# Patient Record
Sex: Female | Born: 1975 | Hispanic: No | Marital: Married | Smoking: Never smoker
Health system: Southern US, Community
[De-identification: ages and names within clinical notes are randomized; demographics above are authoritative.]

## PROBLEM LIST (undated history)

## (undated) ENCOUNTER — Ambulatory Visit (HOSPITAL_COMMUNITY): Admission: EM | Disposition: A | Discharge status: Other Acute Inpatient Hospital | Payer: Self-pay

## (undated) DIAGNOSIS — J309 Allergic rhinitis, unspecified: Secondary | ICD-10-CM

## (undated) HISTORY — DX: Allergic rhinitis, unspecified: J30.9

---

## 2006-10-14 ENCOUNTER — Emergency Department (HOSPITAL_COMMUNITY): Admission: EM | Admit: 2006-10-14 | Discharge: 2006-10-14 | Payer: Self-pay | Admitting: Family Medicine

## 2006-10-15 ENCOUNTER — Emergency Department (HOSPITAL_COMMUNITY): Admission: EM | Admit: 2006-10-15 | Discharge: 2006-10-15 | Payer: Self-pay | Admitting: Family Medicine

## 2006-11-01 ENCOUNTER — Emergency Department (HOSPITAL_COMMUNITY): Admission: EM | Admit: 2006-11-01 | Discharge: 2006-11-01 | Payer: Self-pay | Admitting: Family Medicine

## 2007-02-27 ENCOUNTER — Inpatient Hospital Stay (HOSPITAL_COMMUNITY): Admission: AD | Admit: 2007-02-27 | Discharge: 2007-02-28 | Payer: Self-pay | Admitting: Obstetrics & Gynecology

## 2007-02-27 ENCOUNTER — Emergency Department (HOSPITAL_COMMUNITY): Admission: EM | Admit: 2007-02-27 | Discharge: 2007-02-27 | Payer: Self-pay | Admitting: Emergency Medicine

## 2007-07-02 ENCOUNTER — Ambulatory Visit (HOSPITAL_COMMUNITY): Admission: RE | Admit: 2007-07-02 | Discharge: 2007-07-02 | Payer: Self-pay | Admitting: Obstetrics & Gynecology

## 2007-07-07 ENCOUNTER — Emergency Department (HOSPITAL_COMMUNITY): Admission: EM | Admit: 2007-07-07 | Discharge: 2007-07-07 | Payer: Self-pay | Admitting: Emergency Medicine

## 2007-08-14 ENCOUNTER — Ambulatory Visit (HOSPITAL_COMMUNITY): Admission: RE | Admit: 2007-08-14 | Discharge: 2007-08-14 | Payer: Self-pay | Admitting: Family Medicine

## 2007-09-11 ENCOUNTER — Ambulatory Visit (HOSPITAL_COMMUNITY): Admission: RE | Admit: 2007-09-11 | Discharge: 2007-09-11 | Payer: Self-pay | Admitting: Family Medicine

## 2007-09-22 ENCOUNTER — Ambulatory Visit: Payer: Self-pay | Admitting: Family

## 2007-09-22 ENCOUNTER — Inpatient Hospital Stay (HOSPITAL_COMMUNITY): Admission: AD | Admit: 2007-09-22 | Discharge: 2007-09-24 | Payer: Self-pay | Admitting: Obstetrics & Gynecology

## 2011-03-07 LAB — I-STAT 8, (EC8 V) (CONVERTED LAB)
Acid-base deficit: 3 — ABNORMAL HIGH
Bicarbonate: 22.1
Glucose, Bld: 80
TCO2: 23
pCO2, Ven: 40.2 — ABNORMAL LOW
pH, Ven: 7.348 — ABNORMAL HIGH

## 2011-03-07 LAB — POCT URINALYSIS DIP (DEVICE)
Glucose, UA: NEGATIVE
Nitrite: NEGATIVE
Urobilinogen, UA: 1
pH: 5.5

## 2011-03-12 LAB — CBC
HCT: 36.4
Hemoglobin: 11.9 — ABNORMAL LOW
MCHC: 32.7
MCV: 79.6
RBC: 4.58
WBC: 10.2

## 2011-03-12 LAB — RPR: RPR Ser Ql: NONREACTIVE

## 2011-03-29 LAB — DIFFERENTIAL
Basophils Absolute: 0
Basophils Relative: 0
Eosinophils Absolute: 0.1
Eosinophils Relative: 2
Lymphocytes Relative: 23
Lymphs Abs: 1.1
Monocytes Absolute: 0.2
Monocytes Relative: 5
Neutro Abs: 3.3
Neutrophils Relative %: 70

## 2011-03-29 LAB — URINALYSIS, ROUTINE W REFLEX MICROSCOPIC
Protein, ur: NEGATIVE
Urobilinogen, UA: 0.2

## 2011-03-29 LAB — POCT PREGNANCY, URINE: Operator id: 247071

## 2011-03-29 LAB — CBC
HCT: 36.6
Hemoglobin: 12.1
MCHC: 33.1
MCV: 80.2
Platelets: 217
RBC: 4.56
RDW: 13.4
WBC: 4.6

## 2011-03-29 LAB — POCT URINALYSIS DIP (DEVICE)
Hgb urine dipstick: NEGATIVE
Ketones, ur: NEGATIVE
Specific Gravity, Urine: 1.02
pH: 7

## 2011-03-29 LAB — I-STAT 8, (EC8 V) (CONVERTED LAB)
BUN: 11
Chloride: 102
HCT: 40
Hemoglobin: 13.6
Operator id: 247071
Sodium: 136
pCO2, Ven: 40.8 — ABNORMAL LOW

## 2011-03-29 LAB — HCG, QUANTITATIVE, PREGNANCY

## 2011-03-29 LAB — GC/CHLAMYDIA PROBE AMP, GENITAL

## 2012-04-07 ENCOUNTER — Emergency Department (HOSPITAL_COMMUNITY)
Admission: EM | Admit: 2012-04-07 | Discharge: 2012-04-07 | Disposition: A | Discharge status: Home / Self Care | Payer: No Typology Code available for payment source | Attending: Emergency Medicine | Admitting: Emergency Medicine

## 2012-04-07 ENCOUNTER — Emergency Department (HOSPITAL_COMMUNITY): Payer: No Typology Code available for payment source

## 2012-04-07 ENCOUNTER — Encounter (HOSPITAL_COMMUNITY): Payer: Self-pay | Admitting: Emergency Medicine

## 2012-04-07 DIAGNOSIS — M5412 Radiculopathy, cervical region: Secondary | ICD-10-CM

## 2012-04-07 DIAGNOSIS — Y92410 Unspecified street and highway as the place of occurrence of the external cause: Secondary | ICD-10-CM | POA: Insufficient documentation

## 2012-04-07 DIAGNOSIS — S199XXA Unspecified injury of neck, initial encounter: Secondary | ICD-10-CM | POA: Insufficient documentation

## 2012-04-07 DIAGNOSIS — S0993XA Unspecified injury of face, initial encounter: Secondary | ICD-10-CM | POA: Insufficient documentation

## 2012-04-07 DIAGNOSIS — IMO0002 Reserved for concepts with insufficient information to code with codable children: Secondary | ICD-10-CM | POA: Insufficient documentation

## 2012-04-07 DIAGNOSIS — Y9389 Activity, other specified: Secondary | ICD-10-CM | POA: Insufficient documentation

## 2012-04-07 MED ORDER — PREDNISONE 20 MG PO TABS: ORAL_TABLET | ORAL | Status: DC

## 2012-04-07 MED ORDER — PREDNISONE 20 MG PO TABS
40.0000 mg | ORAL_TABLET | Freq: Once | ORAL | Status: AC
  Administered 2012-04-07: 40 mg via ORAL
  Filled 2012-04-07: qty 2

## 2012-04-07 MED ORDER — PREDNISONE 20 MG PO TABS: 40.0000 mg | ORAL_TABLET | Freq: Once | ORAL | Status: DC

## 2012-04-07 NOTE — ED Notes (Addendum)
Vietnamese interpretor use.
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
## 2012-04-07 NOTE — ED Notes (Signed)
Pt and family denies any questions upon discharge.

## 2012-04-09 NOTE — ED Provider Notes (Signed)
Medical screening examination/treatment/procedure(s) were performed by non-physician practitioner and as supervising physician I was immediately available for consultation/collaboration.   Gwyneth Sprout, MD 04/09/12 1328

## 2013-10-25 ENCOUNTER — Encounter (HOSPITAL_COMMUNITY): Payer: Self-pay | Admitting: Emergency Medicine

## 2013-10-25 ENCOUNTER — Emergency Department (INDEPENDENT_AMBULATORY_CARE_PROVIDER_SITE_OTHER)
Admission: EM | Admit: 2013-10-25 | Discharge: 2013-10-25 | Disposition: A | Discharge status: Home / Self Care | Payer: BC Managed Care – PPO | Source: Home / Self Care | Attending: Family Medicine | Admitting: Family Medicine

## 2013-10-25 DIAGNOSIS — J309 Allergic rhinitis, unspecified: Secondary | ICD-10-CM

## 2013-10-25 DIAGNOSIS — N390 Urinary tract infection, site not specified: Secondary | ICD-10-CM

## 2013-10-25 DIAGNOSIS — R300 Dysuria: Secondary | ICD-10-CM

## 2013-10-25 LAB — POCT URINALYSIS DIP (DEVICE)
BILIRUBIN URINE: NEGATIVE
GLUCOSE, UA: NEGATIVE mg/dL
KETONES UR: NEGATIVE mg/dL
NITRITE: POSITIVE — AB
Protein, ur: NEGATIVE mg/dL
Specific Gravity, Urine: 1.025 (ref 1.005–1.030)
Urobilinogen, UA: 0.2 mg/dL (ref 0.0–1.0)
pH: 6.5 (ref 5.0–8.0)

## 2013-10-25 LAB — GLUCOSE, CAPILLARY: GLUCOSE-CAPILLARY: 80 mg/dL (ref 70–99)

## 2013-10-25 LAB — POCT PREGNANCY, URINE: PREG TEST UR: NEGATIVE

## 2013-10-25 MED ORDER — NITROFURANTOIN MONOHYD MACRO 100 MG PO CAPS: 100.0000 mg | ORAL_CAPSULE | Freq: Two times a day (BID) | ORAL | Status: DC

## 2013-10-25 MED ORDER — LORATADINE 10 MG PO TABS: 10.0000 mg | ORAL_TABLET | Freq: Every day | ORAL | Status: DC

## 2013-10-25 MED ORDER — FLUTICASONE PROPIONATE 50 MCG/ACT NA SUSP: 2.0000 | Freq: Every day | NASAL | Status: DC

## 2013-10-25 NOTE — Discharge Instructions (Signed)
Allergic Rhinitis Allergic rhinitis is when the mucous membranes in the nose respond to allergens. Allergens are particles in the air that cause your body to have an allergic reaction. This causes you to release allergic antibodies. Through a chain of events, these eventually cause you to release histamine into the blood stream. Although meant to protect the body, it is this release of histamine that causes your discomfort, such as frequent sneezing, congestion, and an itchy, runny nose.  CAUSES  Seasonal allergic rhinitis (hay fever) is caused by pollen allergens that may come from grasses, trees, and weeds. Year-round allergic rhinitis (perennial allergic rhinitis) is caused by allergens such as house dust mites, pet dander, and mold spores.  SYMPTOMS   Nasal stuffiness (congestion).  Itchy, runny nose with sneezing and tearing of the eyes. DIAGNOSIS  Your health care provider can help you determine the allergen or allergens that trigger your symptoms. If you and your health care provider are unable to determine the allergen, skin or blood testing may be used. TREATMENT  Allergic Rhinitis does not have a cure, but it can be controlled by:  Medicines and allergy shots (immunotherapy).  Avoiding the allergen. Hay fever may often be treated with antihistamines in pill or nasal spray forms. Antihistamines block the effects of histamine. There are over-the-counter medicines that may help with nasal congestion and swelling around the eyes. Check with your health care provider before taking or giving this medicine.  If avoiding the allergen or the medicine prescribed do not work, there are many new medicines your health care provider can prescribe. Stronger medicine may be used if initial measures are ineffective. Desensitizing injections can be used if medicine and avoidance does not work. Desensitization is when a patient is given ongoing shots until the body becomes less sensitive to the allergen.  Make sure you follow up with your health care provider if problems continue. HOME CARE INSTRUCTIONS It is not possible to completely avoid allergens, but you can reduce your symptoms by taking steps to limit your exposure to them. It helps to know exactly what you are allergic to so that you can avoid your specific triggers. SEEK MEDICAL CARE IF:   You have a fever.  You develop a cough that does not stop easily (persistent).  You have shortness of breath.  You start wheezing.  Symptoms interfere with normal daily activities. Document Released: 02/26/2001 Document Revised: 03/24/2013 Document Reviewed: 02/08/2013 [facility] Patient Information 2014 [place], [place].  Dysuria Dysuria is the medical term for pain with urination. There are many causes for dysuria, but urinary tract infection is the most common. If a urinalysis was performed it can show that there is a urinary tract infection. A urine culture confirms that you or your child is sick. You will need to follow up with a healthcare provider because:  If a urine culture was done you will need to know the culture results and treatment recommendations.  If the urine culture was positive, you or your child will need to be put on antibiotics or know if the antibiotics prescribed are the right antibiotics for your urinary tract infection.  If the urine culture is negative (no urinary tract infection), then other causes may need to be explored or antibiotics need to be stopped. Today laboratory work may have been done and there does not seem to be an infection. If cultures were done they will take at least 24 to 48 hours to be completed. Today x-rays may have been taken and they  read as normal. No cause can be found for the problems. The x-rays may be re-read by a radiologist and you will be contacted if additional findings are made. You or your child may have been put on medications to help with this problem until you can see your primary  caregiver. If the problems get better, see your primary caregiver if the problems return. If you were given antibiotics (medications which kill germs), take all of the mediations as directed for the full course of treatment.  If laboratory work was done, you need to find the results. Leave a telephone number where you can be reached. If this is not possible, make sure you find out how you are to get test results. HOME CARE INSTRUCTIONS   Drink lots of fluids. For adults, drink eight, 8 ounce glasses of clear juice or water a day. For children, replace fluids as suggested by your caregiver.  Empty the bladder often. Avoid holding urine for long periods of time.  After a bowel movement, women should cleanse front to back, using each tissue only once.  Empty your bladder before and after sexual intercourse.  Take all the medicine given to you until it is gone. You may feel better in a few days, but TAKE ALL MEDICINE.  Avoid caffeine, tea, alcohol and carbonated beverages, because they tend to irritate the bladder.  In men, alcohol may irritate the prostate.  Only take over-the-counter or prescription medicines for pain, discomfort, or fever as directed by your caregiver.  If your caregiver has given you a follow-up appointment, it is very important to keep that appointment. Not keeping the appointment could result in a chronic or permanent injury, pain, and disability. If there is any problem keeping the appointment, you must call back to this facility for assistance. SEEK IMMEDIATE MEDICAL CARE IF:   Back pain develops.  A fever develops.  There is nausea (feeling sick to your stomach) or vomiting (throwing up).  Problems are no better with medications or are getting worse. MAKE SURE YOU:   Understand these instructions.  Will watch your condition.  Will get help right away if you are not doing well or get worse. Document Released: 03/01/2004 Document Revised: 08/26/2011 Document  Reviewed: 01/07/2008 [facility] IncExitCare Patient Information 2014 [place], [place].  Urinary Tract Infection Urinary tract infections (UTIs) can develop anywhere along your urinary tract. Your urinary tract is your body's drainage system for removing wastes and extra water. Your urinary tract includes two kidneys, two ureters, a bladder, and a urethra. Your kidneys are a pair of bean-shaped organs. Each kidney is about the size of your fist. They are located below your ribs, one on each side of your spine. CAUSES Infections are caused by microbes, which are microscopic organisms, including fungi, viruses, and bacteria. These organisms are so small that they can only be seen through a microscope. Bacteria are the microbes that most commonly cause UTIs. SYMPTOMS  Symptoms of UTIs may vary by age and gender of the patient and by the location of the infection. Symptoms in young women typically include a frequent and intense urge to urinate and a painful, burning feeling in the bladder or urethra during urination. Older women and men are more likely to be tired, shaky, and weak and have muscle aches and abdominal pain. A fever may mean the infection is in your kidneys. Other symptoms of a kidney infection include pain in your back or sides below the ribs, nausea, and vomiting. DIAGNOSIS To diagnose a  UTI, your caregiver will ask you about your symptoms. Your caregiver also will ask to provide a urine sample. The urine sample will be tested for bacteria and white blood cells. White blood cells are made by your body to help fight infection. TREATMENT  Typically, UTIs can be treated with medication. Because most UTIs are caused by a bacterial infection, they usually can be treated with the use of antibiotics. The choice of antibiotic and length of treatment depend on your symptoms and the type of bacteria causing your infection. HOME CARE INSTRUCTIONS  If you were prescribed antibiotics, take them exactly as your  caregiver instructs you. Finish the medication even if you feel better after you have only taken some of the medication.  Drink enough water and fluids to keep your urine clear or pale yellow.  Avoid caffeine, tea, and carbonated beverages. They tend to irritate your bladder.  Empty your bladder often. Avoid holding urine for long periods of time.  Empty your bladder before and after sexual intercourse.  After a bowel movement, women should cleanse from front to back. Use each tissue only once. SEEK MEDICAL CARE IF:   You have back pain.  You develop a fever.  Your symptoms do not begin to resolve within 3 days. SEEK IMMEDIATE MEDICAL CARE IF:   You have severe back pain or lower abdominal pain.  You develop chills.  You have nausea or vomiting.  You have continued burning or discomfort with urination. MAKE SURE YOU:   Understand these instructions.  Will watch your condition.  Will get help right away if you are not doing well or get worse. Document Released: 03/13/2005 Document Revised: 12/03/2011 Document Reviewed: 07/12/2011 [facility] Patient Information 2014 [place], [place].

## 2013-10-25 NOTE — ED Notes (Signed)
Via husband, [place] Pt c/o UTI sx onset 2 week Sx include: dysuria, urinary freq/urgency, abd pain Denies hematuria, back pain, f/n/d Also wants to talk to provider about allergies Alert w/no signs of acute distress.

## 2013-10-25 NOTE — ED Provider Notes (Signed)
Medical screening examination/treatment/procedure(s) were performed by resident physician or non-physician practitioner and as supervising physician I was immediately available for consultation/collaboration.   Amber Guthridge DOUGLAS MD.   Johnetta Sloniker D Merrilyn Legler, MD 10/25/13 2200 

## 2013-10-25 NOTE — ED Provider Notes (Signed)
CSN: [id number]     Arrival date & time 10/25/13  1838 History   First MD Initiated Contact with Patient 10/25/13 2006     Chief Complaint  Patient presents with  . Urinary Tract Infection  . Allergies   (Consider location/radiation/quality/duration/timing/severity/associated sxs/prior Treatment) HPI Comments: Patient reports 2 weeks of dysuria with frequent urination in small amounts. Some mild suprapubic pressure when emptying bladder.  No polydipsia No hematuria, flank pain or fever.  LNMP: receives DepoMedrol Injections No vaginal discharge or bleeding.  Also wishes to mention as an aside that she has some mild "allergy" symptoms.   The history is provided by the patient and a relative. The history is limited by a language barrier. A language interpreter was used.    History reviewed. No pertinent past medical history. History reviewed. No pertinent past surgical history. No family history on file. History  Substance Use Topics  . Smoking status: Never Smoker   . Smokeless tobacco: Not on file  . Alcohol Use: No   OB History   Grav Para Term Preterm Abortions TAB SAB Ect Mult Living                 Review of Systems  All other systems reviewed and are negative.   Allergies  Review of patient's allergies indicates no known allergies.  Home Medications   Prior to Admission medications   Medication Sig Start Date End Date Taking? Authorizing Provider  predniSONE (DELTASONE) 20 MG tablet 3 Tabs PO Days 1-3, then 2 tabs PO Days 4-6, then 1 tab PO Day 7-9, then Half Tab PO Day 10-12 04/07/12   Arman FilterGail K Schulz, NP   BP 145/98  Pulse 99  Temp(Src) 98.5 F (36.9 C) (Oral)  Resp 16  SpO2 100% Physical Exam  Nursing note and vitals reviewed. Constitutional: She is oriented to person, place, and time. She appears well-developed and well-nourished. No distress.  HENT:  Head: Normocephalic and atraumatic.  Right Ear: External ear normal.  Left Ear: External ear normal.   Nose: Nose normal.  Mouth/Throat: Oropharynx is clear and moist.  Eyes: Conjunctivae are normal. Right eye exhibits no discharge. Left eye exhibits no discharge. No scleral icterus.  Cardiovascular: Normal rate, regular rhythm and normal heart sounds.   Pulmonary/Chest: Effort normal and breath sounds normal.  Abdominal: Soft. Normal appearance and bowel sounds are normal. She exhibits no distension. There is no tenderness. There is no rigidity, no rebound, no guarding and no CVA tenderness.  Musculoskeletal: Normal range of motion.  Neurological: She is alert and oriented to person, place, and time.  Skin: Skin is warm and dry. No rash noted. No erythema.  Psychiatric: She has a normal mood and affect. Her behavior is normal.    ED Course  Procedures (including critical care time) Labs Review Labs Reviewed  POCT URINALYSIS DIP (DEVICE) - Abnormal; Notable for the following:    Hgb urine dipstick TRACE (*)    Nitrite POSITIVE (*)    Leukocytes, UA SMALL (*)    All other components within normal limits  URINE CULTURE  GLUCOSE, CAPILLARY  POCT PREGNANCY, URINE    Imaging Review No results found.   MDM   1. Allergic rhinitis   2. Dysuria   3. UTI (lower urinary tract infection)   Claritin and flonase for hayfever. Macrobid as prescribed for infection. Urine sent for C&S. Follow up if no improvement   Jess BartersJennifer Lee WestervillePresson, GeorgiaPA 10/25/13 2103

## 2013-10-26 LAB — POCT URINALYSIS DIP (DEVICE)
BILIRUBIN URINE: NEGATIVE
Glucose, UA: NEGATIVE mg/dL
KETONES UR: NEGATIVE mg/dL
Nitrite: POSITIVE — AB
PH: 6.5 (ref 5.0–8.0)
PROTEIN: NEGATIVE mg/dL
SPECIFIC GRAVITY, URINE: 1.02 (ref 1.005–1.030)
Urobilinogen, UA: 0.2 mg/dL (ref 0.0–1.0)

## 2013-10-28 LAB — URINE CULTURE

## 2013-10-28 NOTE — ED Notes (Signed)
Urine culture: 10,000 colonies E. Coli.  Pt. adequately treated with Macrobid. Desiree LucySuzanne M Whitehall Surgery CenterYork 10/28/2013

## 2014-08-05 ENCOUNTER — Emergency Department (HOSPITAL_COMMUNITY)
Admission: EM | Admit: 2014-08-05 | Discharge: 2014-08-05 | Disposition: A | Discharge status: Home / Self Care | Payer: No Typology Code available for payment source | Attending: Emergency Medicine | Admitting: Emergency Medicine

## 2014-08-05 ENCOUNTER — Encounter (HOSPITAL_COMMUNITY): Payer: Self-pay | Admitting: *Deleted

## 2014-08-05 ENCOUNTER — Emergency Department (HOSPITAL_COMMUNITY): Payer: No Typology Code available for payment source

## 2014-08-05 DIAGNOSIS — Z7952 Long term (current) use of systemic steroids: Secondary | ICD-10-CM | POA: Insufficient documentation

## 2014-08-05 DIAGNOSIS — Z79899 Other long term (current) drug therapy: Secondary | ICD-10-CM | POA: Diagnosis not present

## 2014-08-05 DIAGNOSIS — M542 Cervicalgia: Secondary | ICD-10-CM

## 2014-08-05 DIAGNOSIS — Y998 Other external cause status: Secondary | ICD-10-CM | POA: Insufficient documentation

## 2014-08-05 DIAGNOSIS — S199XXA Unspecified injury of neck, initial encounter: Secondary | ICD-10-CM | POA: Insufficient documentation

## 2014-08-05 DIAGNOSIS — Y9389 Activity, other specified: Secondary | ICD-10-CM | POA: Insufficient documentation

## 2014-08-05 DIAGNOSIS — S3992XA Unspecified injury of lower back, initial encounter: Secondary | ICD-10-CM | POA: Diagnosis not present

## 2014-08-05 DIAGNOSIS — S299XXA Unspecified injury of thorax, initial encounter: Secondary | ICD-10-CM | POA: Diagnosis not present

## 2014-08-05 DIAGNOSIS — Z7951 Long term (current) use of inhaled steroids: Secondary | ICD-10-CM | POA: Insufficient documentation

## 2014-08-05 DIAGNOSIS — Y92410 Unspecified street and highway as the place of occurrence of the external cause: Secondary | ICD-10-CM | POA: Insufficient documentation

## 2014-08-05 DIAGNOSIS — M549 Dorsalgia, unspecified: Secondary | ICD-10-CM

## 2014-08-05 DIAGNOSIS — R0789 Other chest pain: Secondary | ICD-10-CM

## 2014-08-05 MED ORDER — HYDROCODONE-ACETAMINOPHEN 5-325 MG PO TABS: 1.0000 | ORAL_TABLET | ORAL | Status: DC | PRN

## 2014-08-05 MED ORDER — OXYCODONE-ACETAMINOPHEN 5-325 MG PO TABS
1.0000 | ORAL_TABLET | Freq: Once | ORAL | Status: AC
  Administered 2014-08-05: 1 via ORAL
  Filled 2014-08-05: qty 1

## 2014-08-05 MED ORDER — METHOCARBAMOL 500 MG PO TABS: 500.0000 mg | ORAL_TABLET | Freq: Two times a day (BID) | ORAL | Status: DC
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
## 2014-08-05 NOTE — Discharge Instructions (Signed)
Take the prescribed medication as directed to help with pain/soreness. Return to the ED for new or worsening symptoms.

## 2014-08-05 NOTE — ED Notes (Signed)
Patient returned form Xray.  
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
## 2014-12-07 ENCOUNTER — Encounter (HOSPITAL_COMMUNITY): Payer: Self-pay | Admitting: *Deleted

## 2014-12-07 ENCOUNTER — Emergency Department (HOSPITAL_COMMUNITY)
Admission: EM | Admit: 2014-12-07 | Discharge: 2014-12-07 | Disposition: A | Discharge status: Home / Self Care | Payer: BLUE CROSS/BLUE SHIELD | Attending: Emergency Medicine | Admitting: Emergency Medicine

## 2014-12-07 DIAGNOSIS — K029 Dental caries, unspecified: Secondary | ICD-10-CM | POA: Diagnosis not present

## 2014-12-07 DIAGNOSIS — M546 Pain in thoracic spine: Secondary | ICD-10-CM

## 2014-12-07 DIAGNOSIS — S299XXA Unspecified injury of thorax, initial encounter: Secondary | ICD-10-CM | POA: Insufficient documentation

## 2014-12-07 DIAGNOSIS — Z041 Encounter for examination and observation following transport accident: Secondary | ICD-10-CM

## 2014-12-07 DIAGNOSIS — R6884 Jaw pain: Secondary | ICD-10-CM

## 2014-12-07 DIAGNOSIS — S199XXA Unspecified injury of neck, initial encounter: Secondary | ICD-10-CM | POA: Diagnosis not present

## 2014-12-07 DIAGNOSIS — S0993XA Unspecified injury of face, initial encounter: Secondary | ICD-10-CM | POA: Diagnosis present

## 2014-12-07 DIAGNOSIS — S3991XA Unspecified injury of abdomen, initial encounter: Secondary | ICD-10-CM | POA: Insufficient documentation

## 2014-12-07 DIAGNOSIS — Y998 Other external cause status: Secondary | ICD-10-CM | POA: Insufficient documentation

## 2014-12-07 DIAGNOSIS — S0083XA Contusion of other part of head, initial encounter: Secondary | ICD-10-CM | POA: Insufficient documentation

## 2014-12-07 DIAGNOSIS — Z792 Long term (current) use of antibiotics: Secondary | ICD-10-CM | POA: Diagnosis not present

## 2014-12-07 DIAGNOSIS — Z7951 Long term (current) use of inhaled steroids: Secondary | ICD-10-CM | POA: Diagnosis not present

## 2014-12-07 DIAGNOSIS — Y92410 Unspecified street and highway as the place of occurrence of the external cause: Secondary | ICD-10-CM | POA: Insufficient documentation

## 2014-12-07 DIAGNOSIS — Y9389 Activity, other specified: Secondary | ICD-10-CM | POA: Diagnosis not present

## 2014-12-07 DIAGNOSIS — Z79899 Other long term (current) drug therapy: Secondary | ICD-10-CM | POA: Insufficient documentation

## 2014-12-07 DIAGNOSIS — Z043 Encounter for examination and observation following other accident: Secondary | ICD-10-CM

## 2014-12-07 MED ORDER — METHOCARBAMOL 500 MG PO TABS: 500.0000 mg | ORAL_TABLET | Freq: Two times a day (BID) | ORAL | Status: DC

## 2014-12-07 MED ORDER — IBUPROFEN 800 MG PO TABS: 800.0000 mg | ORAL_TABLET | Freq: Three times a day (TID) | ORAL | Status: DC
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
## 2014-12-07 NOTE — ED Notes (Signed)
Declined W/C at D/C and was escorted to lobby by RN. 
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
## 2014-12-07 NOTE — Discharge Instructions (Signed)
Back Pain, Adult Low back pain is very common. About 1 in 5 people have back pain.The cause of low back pain is rarely dangerous. The pain often gets better over time.About half of people with a sudden onset of back pain feel better in just 2 weeks. About 8 in 10 people feel better by 6 weeks.  CAUSES Some common causes of back pain include:  Strain of the muscles or ligaments supporting the spine.  Wear and tear (degeneration) of the spinal discs.  Arthritis.  Direct injury to the back. DIAGNOSIS Most of the time, the direct cause of low back pain is not known.However, back pain can be treated effectively even when the exact cause of the pain is unknown.Answering your caregiver's questions about your overall health and symptoms is one of the most accurate ways to make sure the cause of your pain is not dangerous. If your caregiver needs more information, he or she may order lab work or imaging tests (X-rays or MRIs).However, even if imaging tests show changes in your back, this usually does not require surgery. HOME CARE INSTRUCTIONS For many people, back pain returns.Since low back pain is rarely dangerous, it is often a condition that people can learn to manageon their own.   Remain active. It is stressful on the back to sit or stand in one place. Do not sit, drive, or stand in one place for more than 30 minutes at a time. Take short walks on level surfaces as soon as pain allows.Try to increase the length of time you walk each day.  Do not stay in bed.Resting more than 1 or 2 days can delay your recovery.  Do not avoid exercise or work.Your body is made to move.It is not dangerous to be active, even though your back may hurt.Your back will likely heal faster if you return to being active before your pain is gone.  Pay attention to your body when you bend and lift. Many people have less discomfortwhen lifting if they bend their knees, keep the load close to their bodies,and  avoid twisting. Often, the most comfortable positions are those that put less stress on your recovering back.  Find a comfortable position to sleep. Use a firm mattress and lie on your side with your knees slightly bent. If you lie on your back, put a pillow under your knees.  Only take over-the-counter or prescription medicines as directed by your caregiver. Over-the-counter medicines to reduce pain and inflammation are often the most helpful.Your caregiver may prescribe muscle relaxant drugs.These medicines help dull your pain so you can more quickly return to your normal activities and healthy exercise.  Put ice on the injured area.  Put ice in a plastic bag.  Place a towel between your skin and the bag.  Leave the ice on for 15-20 minutes, 03-04 times a day for the first 2 to 3 days. After that, ice and heat may be alternated to reduce pain and spasms.  Ask your caregiver about trying back exercises and gentle massage. This may be of some benefit.  Avoid feeling anxious or stressed.Stress increases muscle tension and can worsen back pain.It is important to recognize when you are anxious or stressed and learn ways to manage it.Exercise is a great option. SEEK MEDICAL CARE IF:  You have pain that is not relieved with rest or medicine.  You have pain that does not improve in 1 week.  You have new symptoms.  You are generally not feeling well. SEEK   IMMEDIATE MEDICAL CARE IF:   You have pain that radiates from your back into your legs.  You develop new bowel or bladder control problems.  You have unusual weakness or numbness in your arms or legs.  You develop nausea or vomiting.  You develop abdominal pain.  You feel faint. Document Released: 06/03/2005 Document Revised: 12/03/2011 Document Reviewed: 10/05/2013 [employer] Patient Information 2015 [employer]. This information is not intended to replace advice given to you by your health care provider. Make sure you  discuss any questions you have with your health care provider.  
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
## 2016-09-26 ENCOUNTER — Ambulatory Visit (HOSPITAL_COMMUNITY)
Admission: EM | Admit: 2016-09-26 | Discharge: 2016-09-26 | Disposition: A | Discharge status: Home / Self Care | Payer: Self-pay | Attending: Family Medicine | Admitting: Family Medicine

## 2016-09-26 ENCOUNTER — Encounter (HOSPITAL_COMMUNITY): Payer: Self-pay | Admitting: Emergency Medicine

## 2016-09-26 DIAGNOSIS — Z3202 Encounter for pregnancy test, result negative: Secondary | ICD-10-CM

## 2016-09-26 DIAGNOSIS — N72 Inflammatory disease of cervix uteri: Secondary | ICD-10-CM | POA: Insufficient documentation

## 2016-09-26 DIAGNOSIS — R300 Dysuria: Secondary | ICD-10-CM | POA: Insufficient documentation

## 2016-09-26 DIAGNOSIS — R102 Pelvic and perineal pain: Secondary | ICD-10-CM | POA: Insufficient documentation

## 2016-09-26 LAB — POCT URINALYSIS DIP (DEVICE)
Bilirubin Urine: NEGATIVE
Glucose, UA: NEGATIVE mg/dL
Ketones, ur: NEGATIVE mg/dL
Leukocytes, UA: NEGATIVE
Nitrite: NEGATIVE
Protein, ur: 100 mg/dL — AB
Specific Gravity, Urine: >=1.03 (ref 1.005–1.030)
Urobilinogen, UA: 0.2 mg/dL (ref 0.0–1.0)
pH: 6 (ref 5.0–8.0)

## 2016-09-26 LAB — POCT PREGNANCY, URINE: Preg Test, Ur: NEGATIVE

## 2016-09-26 MED ORDER — METRONIDAZOLE 500 MG PO TABS: 500.0000 mg | ORAL_TABLET | Freq: Two times a day (BID) | ORAL | 0 refills | Status: DC

## 2016-09-26 MED ORDER — DOXYCYCLINE HYCLATE 100 MG PO CAPS: 100.0000 mg | ORAL_CAPSULE | Freq: Two times a day (BID) | ORAL | 0 refills | Status: DC

## 2016-09-26 NOTE — Discharge Instructions (Signed)
You have tenderness in the cervix as well as over the female organs of th. This suggests some sort of infection. You will be treated with 2 antibiotics. If you are not getting better he should follow up with your primary care doctor or if necessary return. A culture of your urine will be performed. Be sure to drink plenty of fluids and stay well-hydrated.

## 2016-09-26 NOTE — ED Provider Notes (Signed)
CSN: [id number]     Arrival date & time 09/26/16  1001 History   First MD Initiated Contact with Patient 09/26/16 1106     Chief Complaint  Patient presents with  . Urinary Tract Infection   (Consider location/radiation/quality/duration/timing/severity/associated sxs/prior Treatment) 41 year old [place] female accompanied by significant other female who helps translate. She complains of dysuria with small void volumes of urine this started yesterday. Denies frequency. She is complaining of discomfort over the lower pelvis/suprapubic. Otherwise her only complaint is feeling cold. No nausea or vomiting, diarrhea.      History reviewed. No pertinent past medical history. History reviewed. No pertinent surgical history. No family history on file. Social History  Substance Use Topics  . Smoking status: Never Smoker  . Smokeless tobacco: Not on file  . Alcohol use No   OB History    No data available     Review of Systems  Constitutional: Negative.   HENT: Negative.   Respiratory: Negative.   Gastrointestinal: Negative.   Genitourinary: Positive for dysuria. Negative for frequency, hematuria, menstrual problem and vaginal discharge.  Neurological: Negative.   All other systems reviewed and are negative.   Allergies  Patient has no known allergies.  Home Medications   Prior to Admission medications   Medication Sig Start Date End Date Taking? Authorizing Provider  Calcium-Magnesium-Vitamin D (CALCIUM 500 PO) Take by mouth.   Yes Historical Provider, MD  MedroxyPROGESTERone Acetate (DEPO-PROVERA IM) Inject into the muscle.   Yes Historical Provider, MD  doxycycline (VIBRAMYCIN) 100 MG capsule Take 1 capsule (100 mg total) by mouth 2 (two) times daily. 09/26/16   Hayden Rasmussen, NP  fluticasone (FLONASE) 50 MCG/ACT nasal spray Place 2 sprays into both nostrils daily. 10/25/13   Ria Clock, PA  HYDROcodone-acetaminophen (NORCO/VICODIN) 5-325 MG per tablet Take 1 tablet by  mouth every 4 (four) hours as needed. 08/05/14   Garlon Hatchet, PA-C  ibuprofen (ADVIL,MOTRIN) 800 MG tablet Take 1 tablet (800 mg total) by mouth 3 (three) times daily. 12/07/14   Danelle Berry, PA-C  loratadine (CLARITIN) 10 MG tablet Take 1 tablet (10 mg total) by mouth daily. 10/25/13   Mathis Fare Presson, PA  methocarbamol (ROBAXIN) 500 MG tablet Take 1 tablet (500 mg total) by mouth 2 (two) times daily. 12/07/14   Danelle Berry, PA-C  metroNIDAZOLE (FLAGYL) 500 MG tablet Take 1 tablet (500 mg total) by mouth 2 (two) times daily. X 7 days 09/26/16   Hayden Rasmussen, NP   Meds Ordered and Administered this Visit  Medications - No data to display  BP 124/78 (BP Location: Left Arm)   Pulse 72   Temp 98.6 F (37 C) (Oral)   Resp 16   SpO2 99%  No data found.   Physical Exam  Constitutional: She is oriented to person, place, and time. She appears well-developed and well-nourished. No distress.  Eyes: EOM are normal.  Neck: Neck supple.  Cardiovascular: Normal rate, regular rhythm and normal heart sounds.   Pulmonary/Chest: Effort normal and breath sounds normal. No respiratory distress.  Abdominal: Soft. Bowel sounds are normal. She exhibits no distension.  Tenderness over the lower most mid abdomen and suprapubic area. Tenderness bilaterally over the anterior pelvis. Palpable masses likely stool along the transverse colon. Mildly tender.  Genitourinary:  Genitourinary Comments: Normal external female genitalia. No evidence of Bartholin gland enlargement or infection, meatus is pink no surrounding erythema or swelling. No vaginal discharge is appreciated. No evidence of vaginal atrophy. Cervix isolated.  The cervix is midline. There is a patch of lumpy erythemal at the 6:00 position. This area is friable and swabbing around the os produces a blood flow. No discharge is seen. Repeated exam with CM produces tenderness. There is also tenderness to the bilateral adnexa.  Musculoskeletal: She exhibits  no edema.  Neurological: She is alert and oriented to person, place, and time. She exhibits normal muscle tone.  Skin: Skin is warm and dry.  Psychiatric: She has a normal mood and affect.  Nursing note and vitals reviewed.   Urgent Care Course     Procedures (including critical care time)  Labs Review Labs Reviewed  POCT URINALYSIS DIP (DEVICE) - Abnormal; Notable for the following:       Result Value   Hgb urine dipstick TRACE (*)    Protein, ur 100 (*)    All other components within normal limits  URINE CULTURE  POCT PREGNANCY, URINE  CERVICOVAGINAL ANCILLARY ONLY    Imaging Review No results found.   Visual Acuity Review  Right Eye Distance:   Left Eye Distance:   Bilateral Distance:    Right Eye Near:   Left Eye Near:    Bilateral Near:         MDM   1. Pelvic pain   2. Cervicitis   3. Dysuria    Patrecia Pace RN present You have tenderness in the cervix as well as over the female organs of th. This suggests some sort of infection. You will be treated with 2 antibiotics. If you are not getting better he should follow up with your primary care doctor or if necessary return. A culture of your urine will be performed. Be sure to drink plenty of fluids and stay well-hydrated. Meds ordered this encounter  Medications  . Calcium-Magnesium-Vitamin D (CALCIUM 500 PO)    Sig: Take by mouth.  . MedroxyPROGESTERone Acetate (DEPO-PROVERA IM)    Sig: Inject into the muscle.  Marland Kitchen doxycycline (VIBRAMYCIN) 100 MG capsule    Sig: Take 1 capsule (100 mg total) by mouth 2 (two) times daily.    Dispense:  20 capsule    Refill:  0    Order Specific Question:   Supervising Provider    Answer:   Elvina Sidle [[id number]]  . metroNIDAZOLE (FLAGYL) 500 MG tablet    Sig: Take 1 tablet (500 mg total) by mouth 2 (two) times daily. X 7 days    Dispense:  14 tablet    Refill:  0    Order Specific Question:   Supervising Provider    Answer:   Elvina Sidle [[id number]]        Hayden Rasmussen, NP 09/26/16 1145

## 2016-09-26 NOTE — ED Triage Notes (Signed)
Pain with urination and low abdominal pain, onset yesterday

## 2016-09-27 LAB — CERVICOVAGINAL ANCILLARY ONLY
Bacterial vaginitis: NEGATIVE
Chlamydia: NEGATIVE
Neisseria Gonorrhea: NEGATIVE
TRICH (WINDOWPATH): NEGATIVE

## 2016-09-27 LAB — URINE CULTURE: Culture: NO GROWTH

## 2017-03-13 ENCOUNTER — Other Ambulatory Visit: Payer: Self-pay | Admitting: Obstetrics & Gynecology

## 2017-03-13 DIAGNOSIS — Z1231 Encounter for screening mammogram for malignant neoplasm of breast: Secondary | ICD-10-CM

## 2017-06-17 ENCOUNTER — Telehealth (HOSPITAL_COMMUNITY): Payer: Self-pay | Admitting: Emergency Medicine

## 2017-06-17 ENCOUNTER — Ambulatory Visit (HOSPITAL_COMMUNITY)
Admission: EM | Admit: 2017-06-17 | Discharge: 2017-06-17 | Disposition: A | Discharge status: Home / Self Care | Payer: Self-pay | Attending: Family Medicine | Admitting: Family Medicine

## 2017-06-17 ENCOUNTER — Encounter (HOSPITAL_COMMUNITY): Payer: Self-pay | Admitting: Emergency Medicine

## 2017-06-17 ENCOUNTER — Other Ambulatory Visit: Payer: Self-pay

## 2017-06-17 DIAGNOSIS — R197 Diarrhea, unspecified: Secondary | ICD-10-CM | POA: Insufficient documentation

## 2017-06-17 DIAGNOSIS — R109 Unspecified abdominal pain: Secondary | ICD-10-CM | POA: Insufficient documentation

## 2017-06-17 LAB — POCT URINALYSIS DIP (DEVICE)
Bilirubin Urine: NEGATIVE
Glucose, UA: NEGATIVE mg/dL
Hgb urine dipstick: NEGATIVE
Ketones, ur: NEGATIVE mg/dL
Leukocytes, UA: NEGATIVE
Nitrite: NEGATIVE
Protein, ur: NEGATIVE mg/dL
Specific Gravity, Urine: 1.025 (ref 1.005–1.030)
Urobilinogen, UA: 0.2 mg/dL (ref 0.0–1.0)
pH: 6 (ref 5.0–8.0)

## 2017-06-17 MED ORDER — LOPERAMIDE HCL 2 MG PO CAPS: 2.0000 mg | ORAL_CAPSULE | Freq: Four times a day (QID) | ORAL | 0 refills | Status: DC | PRN

## 2017-06-17 NOTE — ED Triage Notes (Signed)
Pt c/o generalized abdominal pain and diarrhea for a week.

## 2017-06-17 NOTE — Discharge Instructions (Signed)
Bring the stool sample back for analysis.

## 2017-06-17 NOTE — ED Provider Notes (Signed)
06/17/2017 1:30 PM   DOB: 07/21/1975 / MRN: [id number]  SUBJECTIVE:  Tracy Campos is a 42 y.o. female presenting for diarrhea that started 6 days ago and is not getting better.  Having 6-7 loose non bloody stools daily. She can tolerate PO liquids. Has not tried meds yet.   She has No Known Allergies.   She  has no past medical history on file.    She  reports that  has never smoked. She does not have any smokeless tobacco history on file. She reports that she does not drink alcohol or use drugs. She  has no sexual activity history on file. The patient  has no past surgical history on file.  Her family history is not on file.  Review of Systems  Constitutional: Negative for fever.  Gastrointestinal: Positive for abdominal pain and diarrhea. Negative for blood in stool, heartburn, melena, nausea and vomiting.  Skin: Negative for itching and rash.  Neurological: Negative for weakness.    OBJECTIVE:  BP 124/82   Pulse 86   Temp 98.4 F (36.9 C)   Resp 16   SpO2 100%   Physical Exam  Cardiovascular: Normal rate.  Pulmonary/Chest: Effort normal.  Abdominal: Soft. She exhibits no distension and no mass. Bowel sounds are increased. There is no tenderness. There is no rebound, no guarding and no CVA tenderness.    Results for orders placed or performed during the hospital encounter of 06/17/17 (from the past 72 hour(s))  POCT urinalysis dip (device)     Status: None   Collection Time: 06/17/17 12:20 PM  Result Value Ref Range   Glucose, UA NEGATIVE NEGATIVE mg/dL   Bilirubin Urine NEGATIVE NEGATIVE   Ketones, ur NEGATIVE NEGATIVE mg/dL   Specific Gravity, Urine 1.025 1.005 - 1.030   Hgb urine dipstick NEGATIVE NEGATIVE   pH 6.0 5.0 - 8.0   Protein, ur NEGATIVE NEGATIVE mg/dL   Urobilinogen, UA 0.2 0.0 - 1.0 mg/dL   Nitrite NEGATIVE NEGATIVE   Leukocytes, UA NEGATIVE NEGATIVE    Comment: Biochemical Testing Only. Please order routine urinalysis from main lab if confirmatory testing  is needed.    No results found.  ASSESSMENT AND PLAN: Her abdomen exam is benign. This may be e coli or c diff.  Will run a PCR and then treat accordingly.  Immodium and fluids for now.      The patient is advised to call or return to clinic if she does not see an improvement in symptoms, or to seek the care of the closest emergency department if she worsens with the above plan.   Deliah BostonMichael Matison Nuccio, MHS, PA-C 06/17/2017 1:30 PM   Ofilia Neaslark, Nieves Barberi L, PA-C 06/17/17 1333

## 2017-06-18 LAB — GASTROINTESTINAL PANEL BY PCR, STOOL (REPLACES STOOL CULTURE)
ADENOVIRUS F40/41: NOT DETECTED
Astrovirus: NOT DETECTED
CYCLOSPORA CAYETANENSIS: NOT DETECTED
Campylobacter species: NOT DETECTED
Cryptosporidium: NOT DETECTED
ENTEROPATHOGENIC E COLI (EPEC): NOT DETECTED
Entamoeba histolytica: NOT DETECTED
Enteroaggregative E coli (EAEC): NOT DETECTED
Enterotoxigenic E coli (ETEC): NOT DETECTED
Giardia lamblia: NOT DETECTED
Norovirus GI/GII: NOT DETECTED
Plesimonas shigelloides: NOT DETECTED
Rotavirus A: NOT DETECTED
Salmonella species: NOT DETECTED
Sapovirus (I, II, IV, and V): NOT DETECTED
Shiga like toxin producing E coli (STEC): NOT DETECTED
Shigella/Enteroinvasive E coli (EIEC): NOT DETECTED
VIBRIO SPECIES: NOT DETECTED
Vibrio cholerae: NOT DETECTED
YERSINIA ENTEROCOLITICA: NOT DETECTED

## 2017-11-17 ENCOUNTER — Encounter (HOSPITAL_COMMUNITY): Payer: Self-pay | Admitting: Emergency Medicine

## 2017-11-17 ENCOUNTER — Ambulatory Visit (HOSPITAL_COMMUNITY)
Admission: EM | Admit: 2017-11-17 | Discharge: 2017-11-17 | Disposition: A | Discharge status: Home / Self Care | Payer: Self-pay | Attending: Family Medicine | Admitting: Family Medicine

## 2017-11-17 DIAGNOSIS — R1084 Generalized abdominal pain: Secondary | ICD-10-CM

## 2017-11-17 MED ORDER — ESOMEPRAZOLE MAGNESIUM 20 MG PO CPDR: 20.0000 mg | DELAYED_RELEASE_CAPSULE | Freq: Every day | ORAL | 0 refills | Status: DC

## 2017-11-17 MED ORDER — POLYETHYLENE GLYCOL 3350 17 G PO PACK: 17.0000 g | PACK | Freq: Every day | ORAL | 0 refills | Status: DC

## 2017-11-17 MED ORDER — GI COCKTAIL ~~LOC~~
30.0000 mL | Freq: Once | ORAL | Status: AC
  Administered 2017-11-17: 30 mL via ORAL

## 2017-11-17 MED ORDER — GI COCKTAIL ~~LOC~~
ORAL | Status: AC
  Filled 2017-11-17: qty 30

## 2017-11-17 NOTE — Discharge Instructions (Signed)
Start nexium for possible acid reflux causing symptoms. Start miralax for better bowel movement. Stay up right the first 30-60 mins after eating, and do not eat within 2-3 hours of going to bed. Monitor for worsening symptoms, worsening abdominal pain, nausea/vomiting, fever, follow up for reevaluation needed.

## 2017-11-17 NOTE — ED Provider Notes (Signed)
[facility]    CSN: [id number] Arrival date & time: 11/17/17  1005     History   Chief Complaint Chief Complaint  Patient presents with  . Abdominal Pain    HPI Tracy Campos is a 42 y.o. female.   42 year old female comes in with 3-week history of generalized abdominal pain.  She has also had belching that is worse after eating.  States generalized abdominal pain is constant without obvious aggravating or alleviating factor.  States when she lays down her stomach and back "feels hot".  Denies nausea, vomiting, diarrhea.  Last BM this morning, no straining or hard stools, does have irregular BMs.  Denies fever, chills, night sweats.  Denies urinary symptoms such as frequency, dysuria, hematuria.  She has not taken anything for the symptoms.  She denies alcohol use, chronic NSAID use.  Never smoker.  Denies illicit drug use.     History reviewed. No pertinent past medical history.  There are no active problems to display for this patient.   History reviewed. No pertinent surgical history.  OB History   None      Home Medications    Prior to Admission medications   Medication Sig Start Date End Date Taking? Authorizing Provider  Calcium-Magnesium-Vitamin D (CALCIUM 500 PO) Take by mouth.    [provider]  esomeprazole (NEXIUM) 20 MG capsule Take 1 capsule (20 mg total) by mouth daily. 11/17/17   Cathie Hoops, Maclin Guerrette V, PA-C  loperamide (IMODIUM) 2 MG capsule Take 1 capsule (2 mg total) by mouth 4 (four) times daily as needed for diarrhea or loose stools. 06/17/17   Mardella Layman, MD  MedroxyPROGESTERone Acetate (DEPO-PROVERA IM) Inject into the muscle.    [provider]  polyethylene glycol (MIRALAX) packet Take 17 g by mouth daily. 11/17/17   Belinda Fisher, PA-C    Family History History reviewed. No pertinent family history.  Social History Social History   Tobacco Use  . Smoking status: Never Smoker  Substance Use Topics  . Alcohol use: No  . Drug use:  No     Allergies   Patient has no known allergies.   Review of Systems Review of Systems  Reason unable to perform ROS: See HPI as above.     Physical Exam Triage Vital Signs ED Triage Vitals [11/17/17 1044]  Enc Vitals Group     BP 117/74     Pulse Rate 88     Resp 18     Temp 98.6 F (37 C)     Temp Source Oral     SpO2 98 %     Weight      Height      Head Circumference      Peak Flow      Pain Score      Pain Loc      Pain Edu?      Excl. in GC?    No data found.  Updated Vital Signs BP 117/74 (BP Location: Right Arm)   Pulse 88   Temp 98.6 F (37 C) (Oral)   Resp 18   SpO2 98%   Physical Exam  Constitutional: She is oriented to person, place, and time. She appears well-developed and well-nourished. No distress.  HENT:  Head: Normocephalic and atraumatic.  Eyes: Pupils are equal, round, and reactive to light. Conjunctivae are normal.  Cardiovascular: Normal rate, regular rhythm and normal heart sounds. Exam reveals no gallop and no friction rub.  No murmur heard. Pulmonary/Chest:  Effort normal and breath sounds normal. She has no wheezes. She has no rales.  Abdominal: Soft. She exhibits no mass. Bowel sounds are increased. There is no tenderness. There is no rigidity, no rebound, no guarding and no CVA tenderness.  Neurological: She is alert and oriented to person, place, and time.  Skin: Skin is warm and dry.  Psychiatric: She has a normal mood and affect. Her behavior is normal. Judgment normal.   UC Treatments / Results  Labs (all labs ordered are listed, but only abnormal results are displayed) Labs Reviewed - No data to display  EKG None  Radiology No results found.  Procedures Procedures (including critical care time)  Medications Ordered in UC Medications  gi cocktail (Maalox,Lidocaine,Donnatal) (30 mLs Oral Given 11/17/17 1120)    Initial Impression / Assessment and Plan / UC Course  I have reviewed the triage vital signs and the  nursing notes.  Pertinent labs & imaging results that were available during my care of the patient were reviewed by me and considered in my medical decision making (see chart for details).    No alarming signs on exam. Patient with mild improvement after GI cocktail.  Will treat for possible GERD with Nexium.  MiraLAX for possible constipation.  Push fluids.  Return precautions given.  Patient expresses understanding and agrees to plan.  Final Clinical Impressions(s) / UC Diagnoses   Final diagnoses:  Generalized abdominal pain    ED Prescriptions    Medication Sig Dispense Auth. Provider   esomeprazole (NEXIUM) 20 MG capsule Take 1 capsule (20 mg total) by mouth daily. 15 capsule Millette Halberstam V, PA-C   polyethylene glycol (MIRALAX) packet Take 17 g by mouth daily. [address]14 each Threasa AlphaYu, Purity Irmen V, PA-C        Laqueisha Catalina V, PA-C 11/17/17 1131

## 2017-11-17 NOTE — ED Triage Notes (Signed)
Pt sts abd pain with belching x 3 days

## 2018-09-17 ENCOUNTER — Other Ambulatory Visit: Payer: Self-pay

## 2018-09-17 ENCOUNTER — Ambulatory Visit (HOSPITAL_COMMUNITY)
Admission: EM | Admit: 2018-09-17 | Discharge: 2018-09-17 | Disposition: A | Discharge status: Home / Self Care | Payer: Self-pay | Attending: Family Medicine | Admitting: Family Medicine

## 2018-09-17 ENCOUNTER — Encounter (HOSPITAL_COMMUNITY): Payer: Self-pay

## 2018-09-17 DIAGNOSIS — K219 Gastro-esophageal reflux disease without esophagitis: Secondary | ICD-10-CM

## 2018-09-17 DIAGNOSIS — M779 Enthesopathy, unspecified: Secondary | ICD-10-CM

## 2018-09-17 MED ORDER — LIDOCAINE VISCOUS HCL 2 % MT SOLN
OROMUCOSAL | Status: AC
  Filled 2018-09-17: qty 15

## 2018-09-17 MED ORDER — ACETAMINOPHEN 500 MG PO TABS: 500.0000 mg | ORAL_TABLET | Freq: Four times a day (QID) | ORAL | 0 refills | Status: DC | PRN

## 2018-09-17 MED ORDER — ALUM & MAG HYDROXIDE-SIMETH 200-200-20 MG/5ML PO SUSP
30.0000 mL | Freq: Once | ORAL | Status: AC
  Administered 2018-09-17: 16:00:00 30 mL via ORAL

## 2018-09-17 MED ORDER — METHYLPREDNISOLONE ACETATE 40 MG/ML IJ SUSP
40.0000 mg | Freq: Once | INTRAMUSCULAR | Status: AC
  Administered 2018-09-17: 16:00:00 40 mg via INTRAMUSCULAR

## 2018-09-17 MED ORDER — METHYLPREDNISOLONE ACETATE 40 MG/ML IJ SUSP
INTRAMUSCULAR | Status: AC
  Filled 2018-09-17: qty 1

## 2018-09-17 MED ORDER — LIDOCAINE VISCOUS HCL 2 % MT SOLN
15.0000 mL | Freq: Once | OROMUCOSAL | Status: AC
  Administered 2018-09-17: 16:00:00 15 mL via ORAL

## 2018-09-17 MED ORDER — OMEPRAZOLE 20 MG PO CPDR: 20.0000 mg | DELAYED_RELEASE_CAPSULE | Freq: Every day | ORAL | 1 refills | Status: DC

## 2018-09-17 MED ORDER — ALUM & MAG HYDROXIDE-SIMETH 200-200-20 MG/5ML PO SUSP
ORAL | Status: AC
  Filled 2018-09-17: qty 30

## 2018-09-17 NOTE — ED Triage Notes (Signed)
Patient presents to Urgent Care with complaints of abdominal pain since this morning, denies vomiting. Patient states she has not tried any otc meds pta, pt also complains of intermittent left hand numbness x3 months.

## 2018-09-17 NOTE — Discharge Instructions (Signed)
Steroid injection given here for pain and inflammation in the wrist Thumb spica splint for immobilization Rest, ice, elevate the wrist Tylenol extra strength for pain.   GI cocktail given for stomach upset and acid reflux We are going to try omeprazole 20 mg once daily Make sure that you take the omeprazole 30- 60 minutes prior to a meal with a glass of water.  Avoid spicy, greasy foods, caffeine, chocolate and milk products.  No eating 2-3 hours before bedtime. Elevate the head of the bed 30 degrees.  Try this for a few weeks to see if this improves your symptoms.  If you don't see any improvement or your symptoms worsen please follow up

## 2018-09-18 NOTE — ED Provider Notes (Signed)
[facility]    CSN: [id number] Arrival date & time: 09/17/18  1425     History   Chief Complaint Chief Complaint  Patient presents with  . Abdominal Pain  . Hand Numbness    HPI Tracy Campos is a 43 y.o. female.   Patient is a 43 year old female who presents today multiple complaints.  First complaint being epigastric discomfort with increased gas, burping.  This is been constant since this morning.  Reports that it started after eating.  Denies any associated vomiting, nausea or diarrhea.  She has not take anything for her symptoms.  Denies any chest pain or shortness of breath.  No fevers, chills, night sweats, weight loss.  No recent traveling or sick contacts.  She is also having some left wrist pain that has been intermittent over the past couple of months.  She has had some associated numbness at times.  She does repetitive movements with her hands at work.  The problem is worse after a long day at work.  She has not taken anything for this pain.  Denies any injuries to the hand or wrist.  ROS per HPI    Abdominal Pain    History reviewed. No pertinent past medical history.  There are no active problems to display for this patient.   History reviewed. No pertinent surgical history.  OB History   No obstetric history on file.      Home Medications    Prior to Admission medications   Medication Sig Start Date End Date Taking? Authorizing Provider  acetaminophen (TYLENOL) 500 MG tablet Take 1 tablet (500 mg total) by mouth every 6 (six) hours as needed. 09/17/18   Dahlia Byes A, NP  Calcium-Magnesium-Vitamin D (CALCIUM 500 PO) Take by mouth.    [provider]  esomeprazole (NEXIUM) 20 MG capsule Take 1 capsule (20 mg total) by mouth daily. 11/17/17   Cathie Hoops, Amy V, PA-C  loperamide (IMODIUM) 2 MG capsule Take 1 capsule (2 mg total) by mouth 4 (four) times daily as needed for diarrhea or loose stools. 06/17/17   Mardella Layman, MD  MedroxyPROGESTERone  Acetate (DEPO-PROVERA IM) Inject into the muscle.    [provider]  omeprazole (PRILOSEC) 20 MG capsule Take 1 capsule (20 mg total) by mouth daily. 09/17/18   Dahlia Byes A, NP  polyethylene glycol (MIRALAX) packet Take 17 g by mouth daily. 11/17/17   Belinda Fisher, PA-C    Family History History reviewed. No pertinent family history.  Social History Social History   Tobacco Use  . Smoking status: Never Smoker  . Smokeless tobacco: Never Used  Substance Use Topics  . Alcohol use: No  . Drug use: No     Allergies   Patient has no known allergies.   Review of Systems Review of Systems  Gastrointestinal: Positive for abdominal pain.     Physical Exam Triage Vital Signs ED Triage Vitals  Enc Vitals Group     BP 09/17/18 1502 132/85     Pulse Rate 09/17/18 1502 83     Resp 09/17/18 1502 17     Temp 09/17/18 1502 98.3 F (36.8 C)     Temp Source 09/17/18 1502 Oral     SpO2 09/17/18 1502 100 %     Weight --      Height --      Head Circumference --      Peak Flow --      Pain Score 09/17/18 1501 6  Pain Loc --      Pain Edu? --      Excl. in GC? --    No data found.  Updated Vital Signs BP 132/85 (BP Location: Left Arm)   Pulse 83   Temp 98.3 F (36.8 C) (Oral)   Resp 17   SpO2 100%   Visual Acuity Right Eye Distance:   Left Eye Distance:   Bilateral Distance:    Right Eye Near:   Left Eye Near:    Bilateral Near:     Physical Exam Vitals signs and nursing note reviewed.  Constitutional:      General: She is not in acute distress.    Appearance: She is well-developed. She is not ill-appearing, toxic-appearing or diaphoretic.  HENT:     Head: Normocephalic and atraumatic.     Mouth/Throat:     Pharynx: Oropharynx is clear.  Cardiovascular:     Rate and Rhythm: Normal rate and regular rhythm.     Heart sounds: Normal heart sounds.  Pulmonary:     Effort: Pulmonary effort is normal.     Breath sounds: Normal breath sounds.  Abdominal:      General: Abdomen is flat. Bowel sounds are normal.     Palpations: Abdomen is soft.     Tenderness: There is abdominal tenderness in the epigastric area.  Musculoskeletal:     Comments: Mild tenderness to left wrist Positive Finkelstein Sensation and radial pulse intact.  Good cap refill No bruising, erythema or deformities  Skin:    General: Skin is warm and dry.  Neurological:     Mental Status: She is alert.  Psychiatric:        Mood and Affect: Mood normal.      UC Treatments / Results  Labs (all labs ordered are listed, but only abnormal results are displayed) Labs Reviewed - No data to display  EKG None  Radiology No results found.  Procedures Procedures (including critical care time)  Medications Ordered in UC Medications  alum & mag hydroxide-simeth (MAALOX/MYLANTA) 200-200-20 MG/5ML suspension 30 mL (30 mLs Oral Given 09/17/18 1555)    And  lidocaine (XYLOCAINE) 2 % viscous mouth solution 15 mL (15 mLs Oral Given 09/17/18 1555)  methylPREDNISolone acetate (DEPO-MEDROL) injection 40 mg (40 mg Intramuscular Given 09/17/18 1555)    Initial Impression / Assessment and Plan / UC Course  I have reviewed the triage vital signs and the nursing notes.  Pertinent labs & imaging results that were available during my care of the patient were reviewed by me and considered in my medical decision making (see chart for details).     Tendinitis-steroid injection given here for pain and inflammation in the wrist Thumb spica splint given for immobilization Instructed to rest, ice, elevate and she can take Tylenol extra strength for pain  GERD-  GI cocktail given here for GERD type symptoms Prescription for omeprazole sent to the pharmacy with instructions on how to use Instructions also given on things to avoid Recommended that if her symptoms continue despite treatment of the next couple weeks she will need to follow-up with GI specialist Patient understanding agrees  Final Clinical Impressions(s) / UC Diagnoses   Final diagnoses:  Tendonitis  Gastroesophageal reflux disease without esophagitis     Discharge Instructions     Steroid injection given here for pain and inflammation in the wrist Thumb spica splint for immobilization Rest, ice, elevate the wrist Tylenol extra strength for pain.   GI cocktail given for stomach  upset and acid reflux We are going to try omeprazole 20 mg once daily Make sure that you take the omeprazole 30- 60 minutes prior to a meal with a glass of water.  Avoid spicy, greasy foods, caffeine, chocolate and milk products.  No eating 2-3 hours before bedtime. Elevate the head of the bed 30 degrees.  Try this for a few weeks to see if this improves your symptoms.  If you don't see any improvement or your symptoms worsen please follow up     ED Prescriptions    Medication Sig Dispense Auth. Provider   omeprazole (PRILOSEC) 20 MG capsule Take 1 capsule (20 mg total) by mouth daily. 30 capsule Keijuan Schellhase A, NP   acetaminophen (TYLENOL) 500 MG tablet Take 1 tablet (500 mg total) by mouth every 6 (six) hours as needed. 30 tablet Dahlia Byes A, NP     Controlled Substance Prescriptions [place] Controlled Substance Registry consulted? Not Applicable   Janace Aris, NP 09/18/18 [phone]

## 2019-05-05 ENCOUNTER — Telehealth: Payer: Self-pay | Admitting: Nurse Practitioner

## 2019-05-05 DIAGNOSIS — R519 Headache, unspecified: Secondary | ICD-10-CM

## 2019-05-05 DIAGNOSIS — R05 Cough: Secondary | ICD-10-CM

## 2019-05-05 DIAGNOSIS — R059 Cough, unspecified: Secondary | ICD-10-CM

## 2019-05-05 DIAGNOSIS — Z20822 Contact with and (suspected) exposure to covid-19: Secondary | ICD-10-CM

## 2019-05-05 DIAGNOSIS — R432 Parageusia: Secondary | ICD-10-CM

## 2019-05-05 DIAGNOSIS — R430 Anosmia: Secondary | ICD-10-CM

## 2019-05-05 DIAGNOSIS — Z20828 Contact with and (suspected) exposure to other viral communicable diseases: Secondary | ICD-10-CM

## 2019-05-05 MED ORDER — BENZONATATE 100 MG PO CAPS: 100.0000 mg | ORAL_CAPSULE | Freq: Three times a day (TID) | ORAL | 0 refills | Status: DC | PRN

## 2019-05-05 NOTE — Addendum Note (Signed)
Addended by: Chevis Pretty on: 05/05/2019 09:02 AM   Modules accepted: Orders

## 2019-05-05 NOTE — Progress Notes (Signed)
E-Visit for Corona Virus Screening   Your current symptoms could be consistent with the coronavirus.  Many health care providers can now test patients at their office but not all are.  [facility] has multiple testing sites. For information on our COVID testing locations and hours go to [email or web address]  Please quarantine yourself while awaiting your test results.  We are enrolling you in our MyChart Home Montioring for COVID19 . Daily you will receive a questionnaire within the MyChart website. Our COVID 19 response team willl be monitoriing your responses daily. Please continue good preventive care measures, including:  frequent hand-washing, avoid touching your face, cover coughs/sneezes, stay out of crowds and keep a 6 foot distance from others.    You can go to one of the  testing sites listed below, while they are opened (see hours). You do not need a doctors order to be tested for covid.You do need to self-isolate until your results return and if positive 14 days from when your symptoms started and until you are 3 days symptom free.   Testing Locations (Monday - Friday, 10a.m. - 3:30 p.m.)   [place]: [facility] (Visitor Entrance), [address], [place], [place] -   [place]: [facility], [address], [place], [place] (entrance off [address]) -   [place] (Closed each Monday): [address], [place], [place] - the short stay covered drive at [facility] (Use the [address] entrance to [facility] next to [facility].) -    COVID-19 is a respiratory illness with symptoms that are similar to the flu. Symptoms are typically mild to moderate, but there have been cases of severe illness and death due to the virus. The following symptoms may appear 2-14 days after exposure: . Fever . Cough . Shortness of breath or difficulty  breathing . Chills . Repeated shaking with chills . Muscle pain . Headache . Sore throat . New loss of taste or smell . Fatigue . Congestion or runny nose . Nausea or vomiting . Diarrhea  If you develop fever/cough/breathlessness, please stay home for 10 days with improving symptoms and until you have had 24 hours of no fever (without taking a fever reducer).  Go to the nearest hospital ED for assessment if fever/cough/breathlessness are severe or illness seems like a threat to life.  It is vitally important that if you feel that you have an infection such as this virus or any other virus that you stay home and away from places where you may spread it to others.  You should avoid contact with people age 65 and older.   You should wear a mask or cloth face covering over your nose and mouth if you must be around other people or animals, including pets (even at home). Try to stay at least 6 feet away from other people. This will protect the people around you.  You can use medication such as A prescription cough medication called Tessalon Perles 100 mg. You may take 1-2 capsules every 8 hours as needed for cough  You may also take acetaminophen (Tylenol) as needed for fever.   Reduce your risk of any infection by using the same precautions used for avoiding the common cold or flu:  . Wash your hands often with soap and warm water for at least 20 seconds.  If soap and water are not readily available, use an alcohol-based hand sanitizer with at least 60% alcohol.  .   If coughing or sneezing, cover your mouth and nose by coughing or sneezing into the elbow areas of your shirt or coat, into a tissue or into your sleeve (not your hands). . Avoid shaking hands with others and consider head nods or verbal greetings only. . Avoid touching your eyes, nose, or mouth with unwashed hands.  . Avoid close contact with people who are sick. . Avoid places or events with large numbers of people in one location,  like concerts or sporting events. . Carefully consider travel plans you have or are making. . If you are planning any travel outside or inside the [place], visit the CDC's Travelers' Health webpage for the latest health notices. . If you have some symptoms but not all symptoms, continue to monitor at home and seek medical attention if your symptoms worsen. . If you are having a medical emergency, call 911.  HOME CARE . Only take medications as instructed by your medical team. . Drink plenty of fluids and get plenty of rest. . A steam or ultrasonic humidifier can help if you have congestion.   GET HELP RIGHT AWAY IF YOU HAVE EMERGENCY WARNING SIGNS** FOR COVID-19. If you or someone is showing any of these signs seek emergency medical care immediately. Call 911 or proceed to your closest emergency facility if: . You develop worsening high fever. . Trouble breathing . Bluish lips or face . Persistent pain or pressure in the chest . New confusion . Inability to wake or stay awake . You cough up blood. . Your symptoms become more severe  **This list is not all possible symptoms. Contact your medical provider for any symptoms that are sever or concerning to you.   MAKE SURE YOU   Understand these instructions.  Will watch your condition.  Will get help right away if you are not doing well or get worse.  Your e-visit answers were reviewed by a board certified advanced clinical practitioner to complete your personal care plan.  Depending on the condition, your plan could have included both over the counter or prescription medications.  If there is a problem please reply once you have received a response from your provider.  Your safety is important to us.  If you have drug allergies check your prescription carefully.    You can use MyChart to ask questions about today's visit, request a non-urgent call back, or ask for a work or school excuse for 24 hours related to this e-Visit. If it has  been greater than 24 hours you will need to follow up with your provider, or enter a new e-Visit to address those concerns. You will get an e-mail in the next two days asking about your experience.  I hope that your e-visit has been valuable and will speed your recovery. Thank you for using e-visits.   5-10 minutes spent reviewing and documenting in chart.  

## 2020-02-07 ENCOUNTER — Other Ambulatory Visit: Payer: Self-pay

## 2020-02-07 ENCOUNTER — Ambulatory Visit (HOSPITAL_COMMUNITY)
Admission: EM | Admit: 2020-02-07 | Discharge: 2020-02-07 | Disposition: A | Discharge status: Home / Self Care | Payer: BLUE CROSS/BLUE SHIELD | Attending: Urgent Care | Admitting: Urgent Care

## 2020-02-07 DIAGNOSIS — G8929 Other chronic pain: Secondary | ICD-10-CM | POA: Insufficient documentation

## 2020-02-07 DIAGNOSIS — R1084 Generalized abdominal pain: Secondary | ICD-10-CM | POA: Insufficient documentation

## 2020-02-07 DIAGNOSIS — R1013 Epigastric pain: Secondary | ICD-10-CM | POA: Insufficient documentation

## 2020-02-07 LAB — COMPREHENSIVE METABOLIC PANEL
ALT: 19 U/L (ref 0–44)
AST: 19 U/L (ref 15–41)
Albumin: 4.2 g/dL (ref 3.5–5.0)
Alkaline Phosphatase: 38 U/L (ref 38–126)
Anion gap: 8 (ref 5–15)
BUN: 11 mg/dL (ref 6–20)
CO2: 24 mmol/L (ref 22–32)
Calcium: 8.9 mg/dL (ref 8.9–10.3)
Chloride: 107 mmol/L (ref 98–111)
Creatinine, Ser: 0.69 mg/dL (ref 0.44–1.00)
GFR calc Af Amer: >60 mL/min (ref >60)
GFR calc non Af Amer: >60 mL/min (ref >60)
Glucose, Bld: 88 mg/dL (ref 70–99)
Potassium: 4.1 mmol/L (ref 3.5–5.1)
Sodium: 139 mmol/L (ref 135–145)
Total Bilirubin: 0.3 mg/dL (ref 0.3–1.2)
Total Protein: 7.6 g/dL (ref 6.5–8.1)

## 2020-02-07 LAB — CBC
HCT: 36.3 % (ref 36.0–46.0)
Hemoglobin: 11.4 g/dL — ABNORMAL LOW (ref 12.0–15.0)
MCH: 25.9 pg — ABNORMAL LOW (ref 26.0–34.0)
MCHC: 31.4 g/dL (ref 30.0–36.0)
MCV: 82.3 fL (ref 80.0–100.0)
Platelets: 253 10*3/uL (ref 150–400)
RBC: 4.41 MIL/uL (ref 3.87–5.11)
RDW: 12.6 % (ref 11.5–15.5)
WBC: 4.6 10*3/uL (ref 4.0–10.5)
nRBC: 0 % (ref 0.0–0.2)

## 2020-02-07 LAB — LIPASE, BLOOD: Lipase: 32 U/L (ref 11–51)

## 2020-02-07 MED ORDER — ESOMEPRAZOLE MAGNESIUM 20 MG PO CPDR: 20.0000 mg | DELAYED_RELEASE_CAPSULE | Freq: Two times a day (BID) | ORAL | 1 refills | Status: DC

## 2020-02-07 MED ORDER — FAMOTIDINE 20 MG PO TABS: 20.0000 mg | ORAL_TABLET | Freq: Two times a day (BID) | ORAL | 0 refills | Status: DC

## 2020-02-07 NOTE — ED Triage Notes (Signed)
Pt c/o epigastric abdominal pain radiating into right shoulder blade x 2 days. Denies nausea/vomiting/diarrhea/fever.

## 2020-02-07 NOTE — Discharge Instructions (Addendum)
Start taking Pepcid (famotidine) for short term relief of what I suspect is acid reflux, peptic ulcer disease. This medication is to be taken twice daily before eating a meal. Famotidine actually stops working after about 4-6 weeks if you use it every day. It will be important to use this medication while esomeprazole gets into your system and helps you more long term. You take it the same way you would as famotidine, twice daily before meals. This kind of medication takes about 2-3 weeks to really start working. In the meantime, review the information below regarding GERD.

## 2020-02-07 NOTE — ED Provider Notes (Signed)
[facility]   MRN: [id number] DOB: 1976/02/27  Subjective:   Tracy Campos is a 44 y.o. female presenting for recurrent upper abdominal pain.  Patient states that the pain has become more constant, has felt a little bit of relief when she eats food.  Pain does radiate to her back.  States that she has been told she has acid reflux before and they have previously given her a GI cocktail with lidocaine.  Has not seen gastroenterologist.  Denies fever, weight loss, vomiting, constipation, diarrhea, bloody stools.  Denies alcohol use.  Denies smoking cigarettes.  Denies taking chronic medications.  No Known Allergies  This medical history of acid reflux.  No past surgical history on file.  No family history on file.  Social History   Tobacco Use   Smoking status: Never Smoker   Smokeless tobacco: Never Used  Vaping Use   Vaping Use: Never used  Substance Use Topics   Alcohol use: No   Drug use: No    ROS   Objective:   Vitals: BP (!) 145/67    Pulse 80    Temp 98.9 F (37.2 C)    Resp 16    SpO2 100%   Physical Exam Constitutional:      General: She is not in acute distress.    Appearance: Normal appearance. She is well-developed and normal weight. She is not ill-appearing, toxic-appearing or diaphoretic.  HENT:     Head: Normocephalic and atraumatic.     Right Ear: External ear normal.     Left Ear: External ear normal.     Nose: Nose normal.     Mouth/Throat:     Mouth: Mucous membranes are moist.     Pharynx: Oropharynx is clear.  Eyes:     General: No scleral icterus.    Extraocular Movements: Extraocular movements intact.     Pupils: Pupils are equal, round, and reactive to light.  Cardiovascular:     Rate and Rhythm: Normal rate and regular rhythm.     Pulses: Normal pulses.     Heart sounds: Normal heart sounds. No murmur heard.  No friction rub. No gallop.   Pulmonary:     Effort: Pulmonary effort is normal. No respiratory distress.      Breath sounds: Normal breath sounds. No stridor. No wheezing, rhonchi or rales.  Abdominal:     General: Bowel sounds are normal. There is no distension.     Palpations: Abdomen is soft. There is no mass.     Tenderness: There is generalized abdominal tenderness and tenderness in the right upper quadrant, epigastric area and left upper quadrant. There is right CVA tenderness and left CVA tenderness. There is no guarding or rebound.  Skin:    General: Skin is warm and dry.     Coloration: Skin is not pale.     Findings: No rash.  Neurological:     General: No focal deficit present.     Mental Status: She is alert and oriented to person, place, and time.  Psychiatric:        Mood and Affect: Mood normal.        Behavior: Behavior normal.        Thought Content: Thought content normal.        Judgment: Judgment normal.       Assessment and Plan :   PDMP not reviewed this encounter.  1. Abdominal pain, chronic, epigastric   2. Generalized abdominal pain  The nature of her back, will order labs.  There is no previous imaging on file.  Suspect acid reflux versus PUD, PUD from H. pylori, needs further work-up with gastroenterology.  Information provided to the patient and her daughter.  Recommend setting up an appointment.  In the meantime start famotidine, Nexium daily.  Wean off of famotidine in 2 to 3 weeks. Counseled patient on potential for adverse effects with medications prescribed/recommended today, ER and return-to-clinic precautions discussed, patient verbalized understanding.    Wallis Bamberg, PA-C 02/07/20 1500

## 2020-09-03 ENCOUNTER — Ambulatory Visit (HOSPITAL_COMMUNITY)
Admission: EM | Admit: 2020-09-03 | Discharge: 2020-09-03 | Disposition: A | Discharge status: Home / Self Care | Payer: BLUE CROSS/BLUE SHIELD | Attending: Emergency Medicine | Admitting: Emergency Medicine

## 2020-09-03 ENCOUNTER — Encounter (HOSPITAL_COMMUNITY): Payer: Self-pay

## 2020-09-03 ENCOUNTER — Other Ambulatory Visit: Payer: Self-pay

## 2020-09-03 DIAGNOSIS — H81391 Other peripheral vertigo, right ear: Secondary | ICD-10-CM | POA: Diagnosis not present

## 2020-09-03 DIAGNOSIS — R112 Nausea with vomiting, unspecified: Secondary | ICD-10-CM

## 2020-09-03 MED ORDER — ONDANSETRON 4 MG PO TBDP: 4.0000 mg | ORAL_TABLET | Freq: Three times a day (TID) | ORAL | 0 refills | Status: DC | PRN

## 2020-09-03 MED ORDER — MECLIZINE HCL 25 MG PO TABS: 25.0000 mg | ORAL_TABLET | Freq: Three times a day (TID) | ORAL | 0 refills | Status: DC | PRN

## 2020-09-03 NOTE — ED Triage Notes (Signed)
Pt presents with dizziness and vomiting since yesterday.

## 2020-09-03 NOTE — Discharge Instructions (Addendum)
Take meclizine for the dizziness.  Zofran for nausea.  Push electrolyte containing fluids such as Pedialyte or Gatorade until your urine is clear.  Try the Epley maneuver as this will fix the vertigo.  Follow-up with [facility] if this persists beyond a week.  Go immediately to the emergency department if she has not urinated in 12 hours, fevers above 100.4, if her vertigo changes, or for any other concerns.

## 2020-09-03 NOTE — ED Provider Notes (Signed)
HPI  SUBJECTIVE:  Tracy Campos is a 45 y.o. female who presents with intermittent episodes of dizziness described as vertigo lasting 2 to 3 minutes, accompanied with vomiting starting yesterday.  Husband states that patient has had 4 episodes of emesis.  No nausea, fevers, abdominal pain.  Patient reports generalized weakness.  States that she has intermittent tinnitus in her right ear while vertiginous.  She states that she is having difficulty tolerating p.o. even if not dizzy.  No ear pain, recent viral illness, headache, palpitations, chest pain, shortness of breath, arm or leg weakness, facial droop, slurred speech, discoordination, anorexia.  No visual changes, blurry vision, double vision, change in urine output.  She does not take any medications on a regular basis.  She states that she has had symptoms like this before and was seen/treated here last year.  She has a past medical history of vertigo.  No history of diabetes, hypertension, arrhythmia, atrial fibrillation, GERD.  LMP: Does not remember.  Denies possibility being pregnant.  PMD: None.  Attempted to use the language line, no interpreter speaking [place] available.  All history obtained through husband and daughter.   History reviewed. No pertinent past medical history.  History reviewed. No pertinent surgical history.  History reviewed. No pertinent family history.  Social History   Tobacco Use  . Smoking status: Never Smoker  . Smokeless tobacco: Never Used  Vaping Use  . Vaping Use: Never used  Substance Use Topics  . Alcohol use: No  . Drug use: No    No current facility-administered medications for this encounter.  Current Outpatient Medications:  .  meclizine (ANTIVERT) 25 MG tablet, Take 1 tablet (25 mg total) by mouth 3 (three) times daily as needed for dizziness., Disp: 30 tablet, Rfl: 0 .  ondansetron (ZOFRAN ODT) 4 MG disintegrating tablet, Take 1 tablet (4 mg total) by mouth every 8 (eight) hours as needed  for nausea or vomiting., Disp: 20 tablet, Rfl: 0 .  esomeprazole (NEXIUM) 20 MG capsule, Take 1 capsule (20 mg total) by mouth 2 (two) times daily before a meal., Disp: 60 capsule, Rfl: 1 .  famotidine (PEPCID) 20 MG tablet, Take 1 tablet (20 mg total) by mouth 2 (two) times daily., Disp: 60 tablet, Rfl: 0  No Known Allergies   ROS  As noted in HPI.   Physical Exam  BP (!) 143/84 (BP Location: Right Arm)   Pulse 81   Resp 20   SpO2 99%   Constitutional: Well developed, well nourished, no acute distress Eyes: PERRL, EOMI, conjunctiva normal bilaterally HENT: Normocephalic, atraumatic,mucus membranes moist.  TMs normal bilaterally. Respiratory: Normal inspiratory effort Cardiovascular: Normal rate and rhythm, no murmurs, no gallops, no rubs.  No carotid bruit GI: Nondistended skin: No rash, skin intact Musculoskeletal: No edema, no tenderness, no deformities Neurologic: Alert & oriented x 3, CN III-XII intact, finger-nose, heel shin within normal limits.  Romberg negative.  Tandem gait steady.  Positive Dix-Hallpike on the right. Psychiatric: Speech and behavior appropriate   ED Course   Medications - No data to display  No orders of the defined types were placed in this encounter.  No results found for this or any previous visit (from the past 24 hour(s)). No results found.  ED Clinical Impression  1. Peripheral vertigo involving right ear   2. Non-intractable vomiting with nausea, unspecified vomiting type      ED Assessment/Plan  Presentation consistent with a peripheral vertigo/BPPV with a positive Dix-Hallpike on the right.  She is reporting vomiting primarily after episodes of vertigo.  She denies any abdominal pain whatsoever.  She appears nontoxic.  Her vitals are normal.  Will send home with meclizine, Zofran, Epley maneuver on the right.  Advised pushing electrolyte containing fluids.  Will provide primary care list for ongoing care and order assistance in  finding a PMD.  Follow-up with [facility] if not better in a week.  Discussed MDM, treatment plan, and plan for follow-up with the family.  Discussed sn/sx that should prompt return to the ED. Family agrees with plan.   Meds ordered this encounter  Medications  . meclizine (ANTIVERT) 25 MG tablet    Sig: Take 1 tablet (25 mg total) by mouth 3 (three) times daily as needed for dizziness.    Dispense:  30 tablet    Refill:  0  . ondansetron (ZOFRAN ODT) 4 MG disintegrating tablet    Sig: Take 1 tablet (4 mg total) by mouth every 8 (eight) hours as needed for nausea or vomiting.    Dispense:  20 tablet    Refill:  0    *This clinic note was created using Scientist, clinical (histocompatibility and immunogenetics). Therefore, there may be occasional mistakes despite careful proofreading.  ?    Domenick Gong, MD 09/04/20 [phone]

## 2022-02-20 ENCOUNTER — Encounter (HOSPITAL_COMMUNITY): Payer: Self-pay

## 2022-02-20 ENCOUNTER — Ambulatory Visit (HOSPITAL_COMMUNITY)
Admission: EM | Admit: 2022-02-20 | Discharge: 2022-02-20 | Disposition: A | Discharge status: Home / Self Care | Payer: BC Managed Care – PPO | Attending: Family Medicine | Admitting: Family Medicine

## 2022-02-20 DIAGNOSIS — S161XXA Strain of muscle, fascia and tendon at neck level, initial encounter: Secondary | ICD-10-CM | POA: Diagnosis not present

## 2022-02-20 DIAGNOSIS — S39012A Strain of muscle, fascia and tendon of lower back, initial encounter: Secondary | ICD-10-CM

## 2022-02-20 MED ORDER — IBUPROFEN 800 MG PO TABS: 800.0000 mg | ORAL_TABLET | Freq: Three times a day (TID) | ORAL | 0 refills | Status: DC
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
## 2022-02-21 ENCOUNTER — Telehealth (HOSPITAL_COMMUNITY): Payer: Self-pay | Admitting: Emergency Medicine

## 2022-02-21 MED ORDER — IBUPROFEN 800 MG PO TABS: 800.0000 mg | ORAL_TABLET | Freq: Three times a day (TID) | ORAL | 0 refills | Status: DC
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
## 2023-03-28 ENCOUNTER — Encounter (HOSPITAL_COMMUNITY): Payer: Self-pay

## 2023-03-28 ENCOUNTER — Ambulatory Visit (HOSPITAL_COMMUNITY)
Admission: EM | Admit: 2023-03-28 | Discharge: 2023-03-28 | Disposition: A | Discharge status: Home / Self Care | Payer: BC Managed Care – PPO | Attending: Emergency Medicine | Admitting: Emergency Medicine

## 2023-03-28 DIAGNOSIS — R300 Dysuria: Secondary | ICD-10-CM

## 2023-03-28 DIAGNOSIS — J309 Allergic rhinitis, unspecified: Secondary | ICD-10-CM

## 2023-03-28 DIAGNOSIS — M545 Low back pain, unspecified: Secondary | ICD-10-CM | POA: Diagnosis not present

## 2023-03-28 LAB — POCT URINALYSIS DIP (MANUAL ENTRY)
Bilirubin, UA: NEGATIVE
Blood, UA: NEGATIVE
Glucose, UA: NEGATIVE mg/dL
Leukocytes, UA: NEGATIVE
Nitrite, UA: NEGATIVE
Protein Ur, POC: NEGATIVE mg/dL
Spec Grav, UA: 1.025 (ref 1.010–1.025)
Urobilinogen, UA: 0.2 U/dL
pH, UA: 6 (ref 5.0–8.0)

## 2023-03-28 LAB — POCT URINE PREGNANCY: Preg Test, Ur: NEGATIVE

## 2023-03-28 MED ORDER — CETIRIZINE HCL 10 MG PO TABS: 10.0000 mg | ORAL_TABLET | Freq: Every day | ORAL | 0 refills | Status: DC

## 2023-03-28 MED ORDER — IBUPROFEN 800 MG PO TABS: 800.0000 mg | ORAL_TABLET | Freq: Three times a day (TID) | ORAL | 0 refills | Status: DC

## 2023-03-28 MED ORDER — FLUTICASONE PROPIONATE 50 MCG/ACT NA SUSP: 1.0000 | Freq: Every day | NASAL | 2 refills | Status: DC

## 2023-03-28 NOTE — ED Provider Notes (Signed)
[facility]    CSN: [id number] Arrival date & time: 03/28/23  1609      History   Chief Complaint Chief Complaint  Patient presents with   Back Pain   Nose Problem    Itching and pain    HPI Tracy Campos is a 47 y.o. female.   Patient presents to clinic with significant other, who provides interpretation.  Patient has had left-sided nostril itching and discomfort for the past 2 months.  She has been sneezing a lot as well.  No nasal drainage or sinus pressure. Does not take any daily antihistamines.   She is also having bilateral lumbar back pain and anterior hip pain. This pain has been present for the past week.  No recent injuries or falls, no heavy lifting.  Reports pain with walking and full leg extension.  Denies any numbness or tingling.  No inner leg numbness.  No incontinence.  Does endorse dysuria.     The history is provided by the patient, medical records and the spouse.  Back Pain Associated symptoms: dysuria   Associated symptoms: no abdominal pain and no fever     History reviewed. No pertinent past medical history.  There are no problems to display for this patient.   History reviewed. No pertinent surgical history.  OB History   No obstetric history on file.      Home Medications    Prior to Admission medications   Medication Sig Start Date End Date Taking? Authorizing Provider  cetirizine (ZYRTEC ALLERGY) 10 MG tablet Take 1 tablet (10 mg total) by mouth daily. 03/28/23 04/27/23 Yes Kolette Vey, [place] N, FNP  fluticasone (FLONASE) 50 MCG/ACT nasal spray Place 1 spray into both nostrils daily. 03/28/23  Yes Rinaldo Ratel, [place] N, FNP  ibuprofen (ADVIL) 800 MG tablet Take 1 tablet (800 mg total) by mouth 3 (three) times daily. 03/28/23  Yes Rinaldo Ratel, [place] N, FNP  esomeprazole (NEXIUM) 20 MG capsule Take 1 capsule (20 mg total) by mouth 2 (two) times daily before a meal. 02/07/20   Wallis Bamberg, PA-C  famotidine (PEPCID) 20 MG tablet  Take 1 tablet (20 mg total) by mouth 2 (two) times daily. 02/07/20   Wallis Bamberg, PA-C  meclizine (ANTIVERT) 25 MG tablet Take 1 tablet (25 mg total) by mouth 3 (three) times daily as needed for dizziness. 09/03/20   Domenick Gong, MD  ondansetron (ZOFRAN ODT) 4 MG disintegrating tablet Take 1 tablet (4 mg total) by mouth every 8 (eight) hours as needed for nausea or vomiting. 09/03/20   Domenick Gong, MD    Family History History reviewed. No pertinent family history.  Social History Social History   Tobacco Use   Smoking status: Never   Smokeless tobacco: Never  Vaping Use   Vaping status: Never Used  Substance Use Topics   Alcohol use: No   Drug use: No     Allergies   Patient has no known allergies.   Review of Systems Review of Systems  Constitutional:  Negative for fever.  HENT:  Positive for sneezing. Negative for congestion.   Respiratory:  Negative for cough.   Gastrointestinal:  Negative for abdominal pain, nausea and vomiting.  Genitourinary:  Positive for dysuria and flank pain.  Musculoskeletal:  Positive for back pain.     Physical Exam Triage Vital Signs ED Triage Vitals  Encounter Vitals Group     BP 03/28/23 1652 135/81     Systolic BP Percentile --      Diastolic  BP Percentile --      Pulse Rate 03/28/23 1652 74     Resp 03/28/23 1652 16     Temp 03/28/23 1652 97.9 F (36.6 C)     Temp Source 03/28/23 1652 Oral     SpO2 03/28/23 1652 98 %     Weight --      Height --      Head Circumference --      Peak Flow --      Pain Score 03/28/23 1650 2     Pain Loc --      Pain Education --      Exclude from Growth Chart --    No data found.  Updated Vital Signs BP 135/81 (BP Location: Left Arm)   Pulse 74   Temp 97.9 F (36.6 C) (Oral)   Resp 16   LMP 03/02/2023 (Approximate)   SpO2 98%   Visual Acuity Right Eye Distance:   Left Eye Distance:   Bilateral Distance:    Right Eye Near:   Left Eye Near:    Bilateral Near:      Physical Exam Vitals and nursing note reviewed.  Constitutional:      Appearance: Normal appearance.  HENT:     Head: Normocephalic and atraumatic.     Right Ear: External ear normal.     Left Ear: External ear normal.     Nose: No congestion or rhinorrhea.     Right Turbinates: Enlarged.     Left Turbinates: Enlarged.     Right Sinus: No maxillary sinus tenderness or frontal sinus tenderness.     Left Sinus: No maxillary sinus tenderness or frontal sinus tenderness.     Comments: No sinus tenderness.  Bilateral turbinate enlargement.    Mouth/Throat:     Mouth: Mucous membranes are moist.     Pharynx: No posterior oropharyngeal erythema.  Eyes:     Conjunctiva/sclera: Conjunctivae normal.  Cardiovascular:     Rate and Rhythm: Normal rate and regular rhythm.     Heart sounds: Normal heart sounds. No murmur heard. Pulmonary:     Effort: Pulmonary effort is normal. No respiratory distress.     Breath sounds: Normal breath sounds.  Abdominal:     General: Abdomen is flat. Bowel sounds are normal.     Palpations: Abdomen is soft.     Tenderness: There is no abdominal tenderness. There is no right CVA tenderness, left CVA tenderness or guarding.  Musculoskeletal:        General: No swelling, tenderness, deformity or signs of injury. Normal range of motion.     Cervical back: Normal.     Thoracic back: Normal.     Lumbar back: Normal. No swelling, deformity, spasms or tenderness. Normal range of motion.       Back:     Comments: Lower back pain and anterior hip pain. Non-tender to palpation.   Skin:    General: Skin is warm and dry.  Neurological:     General: No focal deficit present.     Mental Status: She is alert and oriented to person, place, and time.  Psychiatric:        Mood and Affect: Mood normal.        Behavior: Behavior normal. Behavior is cooperative.      UC Treatments / Results  Labs (all labs ordered are listed, but only abnormal results are  displayed) Labs Reviewed  POCT URINALYSIS DIP (MANUAL ENTRY) - Abnormal; Notable for the following  components:      Result Value   Color, UA straw (*)    Clarity, UA cloudy (*)    Ketones, POC UA trace (5) (*)    All other components within normal limits  POCT URINE PREGNANCY    EKG   Radiology No results found.  Procedures Procedures (including critical care time)  Medications Ordered in UC Medications - No data to display  Initial Impression / Assessment and Plan / UC Course  I have reviewed the triage vital signs and the nursing notes.  Pertinent labs & imaging results that were available during my care of the patient were reviewed by me and considered in my medical decision making (see chart for details).  Vitals and triage reviewed, patient is hemodynamically stable.  Sneezing, nasal itching and turbinate swelling consistent with allergic rhinitis, will trial Flonase and antihistamine.  Without cauda equina, inner neck numbness, incontinence or trauma, low concern for emergent pathology to back pain.  Suspect musculoskeletal etiology, will trial anti-inflammatories and symptomatic management.  Patient had endorsed dysuria, abdomen was soft and nontender, urinalysis did not show signs of infection.  Overall physical exam reassuring.  Plan of care, follow-up care and return precautions given, no questions at this time.     Final Clinical Impressions(s) / UC Diagnoses   Final diagnoses:  Allergic rhinitis, unspecified seasonality, unspecified trigger  Acute bilateral low back pain without sciatica  Dysuria     Discharge Instructions      Use the nasal spray daily to help with allergic symptoms like itching and sneezing.  Also suggest taking the daily antihistamine as well.  Her urine did not show any signs of infection.  I believe her back pain is musculoskeletal.  Please rest, do gentle stretching, heat, baths and anti-inflammatories as needed.  She can take 800 mg of  ibuprofen every 8 hours with food.  Return to clinic for any changes or new urgent symptoms.      ED Prescriptions     Medication Sig Dispense Auth. Provider   fluticasone (FLONASE) 50 MCG/ACT nasal spray Place 1 spray into both nostrils daily. 9.9 mL Berneta Sconyers, [place] N, FNP   cetirizine (ZYRTEC ALLERGY) 10 MG tablet Take 1 tablet (10 mg total) by mouth daily. 30 tablet Rinaldo Ratel, [place] N, [place]   ibuprofen (ADVIL) 800 MG tablet Take 1 tablet (800 mg total) by mouth 3 (three) times daily. 21 tablet Sankalp Ferrell, [place] N, [place]      PDMP not reviewed this encounter.   Kyleen Villatoro, [place] N, [place] 03/28/23 [phone]

## 2023-03-28 NOTE — ED Triage Notes (Signed)
nose itching with tenderness to the touch x 2 months. Having back and leg pain as well onset 1 week. No known falls or heavy lifting.

## 2023-03-28 NOTE — Discharge Instructions (Addendum)
Use the nasal spray daily to help with allergic symptoms like itching and sneezing.  Also suggest taking the daily antihistamine as well.  Her urine did not show any signs of infection.  I believe her back pain is musculoskeletal.  Please rest, do gentle stretching, heat, baths and anti-inflammatories as needed.  She can take 800 mg of ibuprofen every 8 hours with food.  Return to clinic for any changes or new urgent symptoms.

## 2023-08-11 ENCOUNTER — Telehealth: Payer: Self-pay

## 2023-08-11 NOTE — Telephone Encounter (Addendum)
 Telephoned patient at home number, no answer and voice mail not an option. Mobile number not in service at this time. [facility] (scholarship)  Telephoned patient at home number. Left a voice message with [facility] (scholarship) contact information.

## 2024-05-17 HISTORY — PX: NO PAST SURGERIES: SHX2092

## 2024-05-18 ENCOUNTER — Ambulatory Visit (HOSPITAL_COMMUNITY): Admission: EM | Admit: 2024-05-18 | Discharge: 2024-05-18 | Disposition: A | Discharge status: Home / Self Care

## 2024-05-18 ENCOUNTER — Encounter (HOSPITAL_COMMUNITY): Payer: Self-pay | Admitting: *Deleted

## 2024-05-18 DIAGNOSIS — R300 Dysuria: Secondary | ICD-10-CM | POA: Insufficient documentation

## 2024-05-18 DIAGNOSIS — N10 Acute tubulo-interstitial nephritis: Secondary | ICD-10-CM | POA: Diagnosis not present

## 2024-05-18 DIAGNOSIS — M545 Low back pain, unspecified: Secondary | ICD-10-CM | POA: Insufficient documentation

## 2024-05-18 DIAGNOSIS — R310 Gross hematuria: Secondary | ICD-10-CM | POA: Insufficient documentation

## 2024-05-18 DIAGNOSIS — R1030 Lower abdominal pain, unspecified: Secondary | ICD-10-CM | POA: Insufficient documentation

## 2024-05-18 LAB — POCT URINE DIPSTICK
Glucose, UA: NEGATIVE mg/dL
Nitrite, UA: NEGATIVE
POC PROTEIN,UA: >=300 — AB
Spec Grav, UA: >=1.03 — AB (ref 1.010–1.025)
Urobilinogen, UA: 0.2 U/dL
pH, UA: 6 (ref 5.0–8.0)

## 2024-05-18 LAB — POCT URINE PREGNANCY: Preg Test, Ur: NEGATIVE

## 2024-05-18 MED ORDER — PHENAZOPYRIDINE HCL 200 MG PO TABS: 200.0000 mg | ORAL_TABLET | Freq: Three times a day (TID) | ORAL | 0 refills | Status: DC

## 2024-05-18 MED ORDER — SULFAMETHOXAZOLE-TRIMETHOPRIM 800-160 MG PO TABS: 1.0000 | ORAL_TABLET | Freq: Two times a day (BID) | ORAL | 0 refills | Status: AC

## 2024-05-18 NOTE — Discharge Instructions (Addendum)
 Take the antibiotics twice daily with food for the next 7 days.  Be sure to take all of your antibiotics.  He can take the Pyridium up to 3 times daily as needed for burning when you urinate, this can change the color of urine to bright orange / yellow.  Ensure you are drinking at least 64 ounces of water daily to help flush the kidneys out.    Symptoms should improve over the next 2 or 3 days on antibiotics, if no improvement or any changes please return to clinic for reevaluation.

## 2024-05-18 NOTE — ED Provider Notes (Signed)
 [facility]    CSN: [id number] Arrival date & time: 05/18/24  9086      History   Chief Complaint Chief Complaint  Patient presents with   Dysuria   Abdominal Pain    HPI Tracy Campos is a 48 y.o. female.   Husband to provide interpretation for visit, per patient request.  Patient presents to clinic over concern of hematuria. Symptoms started last night. She has dysuria, urgency and frequency. Endorses bilateral low back pain. Has not had nausea or emesis. Has lower abdominal pain.      The history is provided by the patient and medical records. The history is limited by a language barrier.  Dysuria Abdominal Pain   History reviewed. No pertinent past medical history.  There are no active problems to display for this patient.   History reviewed. No pertinent surgical history.  OB History   No obstetric history on file.      Home Medications    Prior to Admission medications   Medication Sig Start Date End Date Taking? Authorizing Provider  norethindrone-ethinyl estradiol (LOESTRIN) 1-20 MG-MCG tablet Take 1 tablet by mouth daily.   Yes [provider]  phenazopyridine  (PYRIDIUM ) 200 MG tablet Take 1 tablet (200 mg total) by mouth 3 (three) times daily. 05/18/24  Yes Ashiya Kinkead  N, FNP  sulfamethoxazole -trimethoprim  (BACTRIM  DS) 800-160 MG tablet Take 1 tablet by mouth 2 (two) times daily for 7 days. 05/18/24 05/25/24 Yes Markeesha Char  N, FNP  cetirizine  (ZYRTEC  ALLERGY) 10 MG tablet Take 1 tablet (10 mg total) by mouth daily. 03/28/23 04/27/23  Dreama Andre SAILOR, FNP  esomeprazole  (NEXIUM ) 20 MG capsule Take 1 capsule (20 mg total) by mouth 2 (two) times daily before a meal. 02/07/20   Christopher Savannah, PA-C  famotidine  (PEPCID ) 20 MG tablet Take 1 tablet (20 mg total) by mouth 2 (two) times daily. 02/07/20   Christopher Savannah, PA-C  fluticasone  (FLONASE ) 50 MCG/ACT nasal spray Place 1 spray into both nostrils daily. 03/28/23   Dreama, Quentin Strebel  N,  FNP  ibuprofen  (ADVIL ) 800 MG tablet Take 1 tablet (800 mg total) by mouth 3 (three) times daily. 03/28/23   Dreama, Angenette Daily  N, FNP  meclizine  (ANTIVERT ) 25 MG tablet Take 1 tablet (25 mg total) by mouth 3 (three) times daily as needed for dizziness. 09/03/20   Mortenson, Ashley, MD  ondansetron  (ZOFRAN  ODT) 4 MG disintegrating tablet Take 1 tablet (4 mg total) by mouth every 8 (eight) hours as needed for nausea or vomiting. 09/03/20   Van Knee, MD    Family History History reviewed. No pertinent family history.  Social History Social History   Tobacco Use   Smoking status: Never   Smokeless tobacco: Never  Vaping Use   Vaping status: Never Used  Substance Use Topics   Alcohol use: No   Drug use: No     Allergies   Patient has no known allergies.   Review of Systems Review of Systems  Per HPI  Physical Exam Triage Vital Signs ED Triage Vitals [05/18/24 0937]  Encounter Vitals Group     BP 124/80     Girls Systolic BP Percentile      Girls Diastolic BP Percentile      Boys Systolic BP Percentile      Boys Diastolic BP Percentile      Pulse Rate 87     Resp 16     Temp 98.7 F (37.1 C)     Temp Source Oral  SpO2 97 %     Weight      Height      Head Circumference      Peak Flow      Pain Score      Pain Loc      Pain Education      Exclude from Growth Chart    No data found.  Updated Vital Signs BP 124/80 (BP Location: Left Arm)   Pulse 87   Temp 98.7 F (37.1 C) (Oral)   Resp 16   LMP  (LMP Unknown) Comment: irregular she does not know last date  SpO2 97%   Visual Acuity Right Eye Distance:   Left Eye Distance:   Bilateral Distance:    Right Eye Near:   Left Eye Near:    Bilateral Near:     Physical Exam Vitals and nursing note reviewed.  Constitutional:      Appearance: Normal appearance. She is well-developed.  HENT:     Head: Normocephalic and atraumatic.     Right Ear: External ear normal.     Left Ear: External ear  normal.     Nose: Nose normal.     Mouth/Throat:     Mouth: Mucous membranes are moist.  Eyes:     Conjunctiva/sclera: Conjunctivae normal.  Cardiovascular:     Rate and Rhythm: Normal rate.  Pulmonary:     Effort: Pulmonary effort is normal. No respiratory distress.  Abdominal:     General: Abdomen is flat. Bowel sounds are normal.     Palpations: Abdomen is soft.     Tenderness: There is abdominal tenderness in the suprapubic area. There is right CVA tenderness. There is no left CVA tenderness.  Musculoskeletal:        General: Normal range of motion.  Skin:    General: Skin is warm and dry.  Neurological:     General: No focal deficit present.     Mental Status: She is alert and oriented to person, place, and time.  Psychiatric:        Mood and Affect: Mood normal.        Behavior: Behavior normal.      UC Treatments / Results  Labs (all labs ordered are listed, but only abnormal results are displayed) Labs Reviewed  POCT URINE DIPSTICK - Abnormal; Notable for the following components:      Result Value   Color, UA red (*)    Clarity, UA turbid (*)    Bilirubin, UA small (*)    Ketones, POC UA trace (5) (*)    Spec Grav, UA >=1.030 (*)    Blood, UA large (*)    POC PROTEIN,UA >=300 (*)    Leukocytes, UA Small (1+) (*)    All other components within normal limits  POCT URINE PREGNANCY - Normal  URINE CULTURE    EKG   Radiology No results found.  Procedures Procedures (including critical care time)  Medications Ordered in UC Medications - No data to display  Initial Impression / Assessment and Plan / UC Course  I have reviewed the triage vital signs and the nursing notes.  Pertinent labs & imaging results that were available during my care of the patient were reviewed by me and considered in my medical decision making (see chart for details).  Vitals in triage reviewed, patient is hemodynamically stable.  UA with significant abnormalities, red blood  cells, protein, leukocytes, bilirubin, ketones and high specific gravity.  With the CVA tenderness, flank  pain and significant UA abnormalities, concern for pyelonephritis.  Will treat with Bactrim twice daily for 7 days.  Sending urine for culture.  Urine pregnancy negative.  Abdomen soft with active bowel sounds and mild suprapubic tenderness.  Low concern for acute abdomen.  Staff will contact if we need to modify treatment.  Strict return precautions given if no improvement next 2 or 3 days.  Patient and husband verbalized understanding, no questions at this time.  Work note provided.    Final Clinical Impressions(s) / UC Diagnoses   Final diagnoses:  Gross hematuria  Acute pyelonephritis     Discharge Instructions      Take the antibiotics twice daily with food for the next 7 days.  Be sure to take all of your antibiotics.  He can take the Pyridium up to 3 times daily as needed for burning when you urinate, this can change the color of urine to bright orange / yellow.  Ensure you are drinking at least 64 ounces of water daily to help flush the kidneys out.    Symptoms should improve over the next 2 or 3 days on antibiotics, if no improvement or any changes please return to clinic for reevaluation.     ED Prescriptions     Medication Sig Dispense Auth. Provider   sulfamethoxazole-trimethoprim (BACTRIM DS) 800-160 MG tablet Take 1 tablet by mouth 2 (two) times daily for 7 days. 14 tablet Dreama, Quatisha Zylka  N, FNP   phenazopyridine (PYRIDIUM) 200 MG tablet Take 1 tablet (200 mg total) by mouth 3 (three) times daily. 6 tablet Dreama, Calib Wadhwa  N, FNP      PDMP not reviewed this encounter.   Dreama, Paquita Printy  N, FNP 05/18/24 1032

## 2024-05-18 NOTE — ED Notes (Signed)
 PCP appt made 05/28/2024 pt aware

## 2024-05-18 NOTE — ED Triage Notes (Signed)
 Pt states that she has abdominal pain and dysuria since last night. She has taken no meds.  Husband states he can interpret for her

## 2024-05-19 ENCOUNTER — Ambulatory Visit (HOSPITAL_COMMUNITY): Payer: Self-pay

## 2024-05-19 LAB — URINE CULTURE: Culture: NO GROWTH

## 2024-05-28 ENCOUNTER — Ambulatory Visit: Admitting: Medical

## 2024-06-14 ENCOUNTER — Encounter: Payer: Self-pay | Admitting: Medical

## 2024-06-14 ENCOUNTER — Ambulatory Visit: Admitting: Medical

## 2024-06-14 VITALS — BP 108/68 | HR 78 | Ht 59.5 in | Wt 100.6 lb

## 2024-06-14 DIAGNOSIS — Z1231 Encounter for screening mammogram for malignant neoplasm of breast: Secondary | ICD-10-CM

## 2024-06-14 DIAGNOSIS — Z603 Acculturation difficulty: Secondary | ICD-10-CM | POA: Diagnosis not present

## 2024-06-14 DIAGNOSIS — Z758 Other problems related to medical facilities and other health care: Secondary | ICD-10-CM | POA: Diagnosis not present

## 2024-06-14 DIAGNOSIS — Z124 Encounter for screening for malignant neoplasm of cervix: Secondary | ICD-10-CM

## 2024-06-14 DIAGNOSIS — Z5320 Procedure and treatment not carried out because of patient's decision for unspecified reasons: Secondary | ICD-10-CM

## 2024-06-14 DIAGNOSIS — Z7185 Encounter for immunization safety counseling: Secondary | ICD-10-CM

## 2024-06-14 DIAGNOSIS — Z0000 Encounter for general adult medical examination without abnormal findings: Secondary | ICD-10-CM | POA: Insufficient documentation

## 2024-06-14 DIAGNOSIS — Z1389 Encounter for screening for other disorder: Secondary | ICD-10-CM | POA: Diagnosis not present

## 2024-06-14 NOTE — Patient Instructions (Addendum)
 Ki?m tra s?c kh?e t?ng qut Vi?c ki?m tra s?c kh?e ??nh k? ? qu h?n. B?n ? t? ch?i khm sng l?c ung th? v v ung th? vng ch?u. Xt nghi?m mu ? ???c th?c hi?n cch ?y b?n n?m. - ? cung c?p cc khuy?n ngh? b?ng ti?ng Anh v ti?ng Vi?t.  Hm nay b?n ??n khm s?c kh?e t?ng qut. B?n cho bi?t s?c kh?e c?a mnh v?n t?t. B?n ? t? ch?i lm cc xt nghi?m v ki?m tra t?ng qut hm nay.  D??i ?y l m?t s? khuy?n ngh? cho vi?c khm s?c kh?e ??nh k?:  Ti khuyn b?n nn lm cc xt nghi?m ??nh k? nh? xt nghi?m cng th?c mu, ch?c n?ng gan, th?n v ?i?n gi?i, tuy?n gip v cholesterol.  Ti khuyn b?n nn lm cc xt nghi?m sng l?c ung th? bao g?m ch?p nh? ?nh ?? sng l?c ung th? v, xt nghi?m Pap smear ?? sng l?c ung th? c? t? cung v n?i soi ??i trng ?? sng l?c ung th? ??i trng. Hy xem xt nh?ng ?i?u ny v cho ti bi?t n?u b?n mu?n th?c hi?n b?t k? xt nghi?m sng l?c t?ng qut no.  Ti ho?c m?t trong nh?ng ??ng nghi?p c?a ti ? ?y c th? khm v v lm xt nghi?m Pap smear cho b?n n?u b?n mu?n ???c bc s? n? khm.  Sng l?c ung th? ??i trng ???c th?c hi?n b?i bc s? chuyn khoa tiu ha. Chng ti s? gi?i thi?u b?n ??n bc s? chuyn khoa n?u b?n mu?n th?c hi?n xt nghi?m ny.  Cc lo?i v?c-xin thng th??ng ???c khuy?n co bao g?m v?c-xin u?n vn m?i 10 n?m, tim phng cm hng n?m.  Ti khuyn b?n nn ?i khm nha s? m?i n?m ?? ch?m [place] r?ng mi?ng ??nh k?.  Ti khuyn b?n nn ?i khm m?t m?i n?m ?? ch?m [place] m?t ??nh k?.  Hy ti?p t?c duy tr thi quen ?n u?ng lnh m?nh v t?p th? d?c th??ng xuyn.  Huy?t p, chi?u cao v cn n?ng c?a b?n hm nay ??u bnh th??ng.    Vim m?i d? ?ng - B?n c th? s? d?ng thu?c ch?ng d? ?ng khng c?n k ??n hng ngy nh? vin nn Zyrtec  ho?c Allegra u?ng tr??c khi ?i ng? ho?c thu?c x?t m?i Flonase  vo bu?i sng     General Health Maintenance Routine health maintenance overdue. Declined breast and pelvic cancer screenings. Blood work was  done four years ago. - Provided recommendations in English and Vietnamese.  You were seen today for a general checkup.  You reported that you are doing well.  You declined general labs and evaluation today  You are some recommendations for routine checkup/well visit  I recommend routine labs for screening such as blood counts, liver kidney and electrolytes, thyroid and cholesterol labs  I recommend cancer screening including mammogram for breast cancer screening, Pap smear for cervical cancer screening and colonoscopy for colon cancer screening.  Consider these and let me know if you want to pursue any of these general screenings  I or one of my colleagues here can do your breast exam and Pap smear if you prefer a female provider for the breast and Pap smear  Colon cancer screening is done through a gastroenterologist.  This would be a referral if you want to pursue this  General Vaccines are recommended including tetanus vaccine every 10 years, flu shot yearly  I recommend you see a dentist every year  for routine dental care  I recommend you see an eye doctor yearly for routine eye care  Continue healthy eating habits and regular exercise.  Your blood pressure, height and weight are normal today

## 2024-06-14 NOTE — Progress Notes (Signed)
 "  Name: Tracy Campos   Date of Visit: 06/14/2024   Date of last visit with me: Visit date not found   CHIEF COMPLAINT:  Chief Complaint  Patient presents with   New Patient (Initial Visit)    New pt get established, no concerns       HPI:  Discussed the use of AI scribe software for clinical note transcription with the patient, who gave verbal consent to proceed.  History of Present Illness  Here with husband Ytin who translates today.  No other interprterr available today  He speaks some engliahs and vietnamese.   They are [place] from [place].  They have been in [place]  for many years.  She reports no current health concerns and feels generally well. She has not had a recent well visit and previously sought care at urgent care facilities.  She recently experienced a kidney infection but that resolved with treatment, seen in urgent care.  She suffers from perennial allergic rhinitis, characterized by a runny nose and sneezing. Over-the-counter allergy medication from [facility] provides incomplete relief.  She has no history of surgeries and has not undergone a colonoscopy or mammogram. She does not smoke or consume alcohol.  Her diet consists mainly of vegetables, pork, and fruits like apples and bananas. 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  She lives with her husband and one of her daughters, while her other children are adults living independently.  No problems with bowel or bladder function, and no pain or difficulty with movement. No known allergies to medications.   Allergies[1]  Past Medical History:  Diagnosis Date   Allergic rhinitis     Medications Ordered Prior to Encounter[2]   Current Medications[3]  Family History  Family history unknown: Yes    Past Surgical History:  Procedure Laterality Date   NO PAST SURGERIES  05/2024   Review of Systems  Constitutional:  Negative for chills, fever,  malaise/fatigue and weight loss.  HENT:  Negative for congestion, ear pain, hearing loss, sore throat and tinnitus.   Eyes:  Negative for blurred vision, pain and redness.  Respiratory:  Negative for cough, hemoptysis and shortness of breath.   Cardiovascular:  Negative for chest pain, palpitations, orthopnea, claudication and leg swelling.  Gastrointestinal:  Negative for abdominal pain, blood in stool, constipation, diarrhea, nausea and vomiting.  Genitourinary:  Negative for dysuria, flank pain, frequency, hematuria and urgency.  Musculoskeletal:  Negative for falls, joint pain and myalgias.  Skin:  Negative for itching and rash.  Neurological:  Negative for dizziness, tingling, speech change, weakness and headaches.  Endo/Heme/Allergies:  Negative for polydipsia. Does not bruise/bleed easily.  Psychiatric/Behavioral:  Negative for depression and memory loss. The patient is not nervous/anxious and does not have insomnia.      Objective BP 108/68   Pulse 78   Ht 4' 11.5 (1.511 m)   Wt 100 lb 9.6 oz (45.6 kg)   LMP  (LMP Unknown)   SpO2 100%   BMI 19.98 kg/m    General appearence: alert, no distress, WD/WN, Asian female HEENT: normocephalic, sclerae anicteric, PERRLA, EOMi, nares patent, no discharge or erythema, pharynx normal Oral cavity: MMM, lots of stain on teeth Neck: supple, no lymphadenopathy, no thyromegaly, no masses Heart: RRR, normal S1, S2, no murmurs Lungs: CTA bilaterally, no wheezes, rhonchi, or rales Abdomen: +bs, soft, non tender, non distended, no masses, no hepatomegaly, no splenomegaly Back: non tender Musculoskeletal: limited exam nontender, no swelling, no obvious  deformity Extremities: no edema, no cyanosis, no clubbing Pulses: 2+ symmetric, upper and lower extremities, normal cap refill Neurological: alert, oriented x 3, CN2-12 intact, strength normal upper extremities and lower extremities, sensation normal throughout, DTRs 2+ throughout, no cerebellar  signs, gait normal Psychiatric: normal affect, behavior normal, pleasant  Breast/pelvic - declined    Assessment: Encounter Diagnoses  Name Primary?   Encounter for health maintenance examination in adult Yes   Language barrier    Vaccine counseling    Screening for cervical cancer    Screening mammogram for breast cancer    Screening for hematuria or proteinuria    Assessment examination refused     Plan: Allergic rhinitis -you can use over the counter allergy pill daily such as Zyrtec  or Allegra tablet daily at bedtime or Flonase  nasal spray in the morning.   General Health Maintenance Routine health maintenance overdue. Declined breast and pelvic cancer screenings. Blood work was done four years ago. - Provided recommendations in English and Vietnamese.  You were seen today for a general checkup.  You reported that you are doing well.  You declined general labs and evaluation today  You are some recommendations for routine checkup/well visit  I recommend routine labs for screening such as blood counts, liver kidney and electrolytes, thyroid and cholesterol labs  I recommend cancer screening including mammogram for breast cancer screening, Pap smear for cervical cancer screening and colonoscopy for colon cancer screening.  Consider these and let me know if you want to pursue any of these general screenings  I or one of my colleagues here can do your breast exam and Pap smear if you prefer a female provider for the breast and Pap smear  Colon cancer screening is done through a gastroenterologist.  This would be a referral if you want to pursue this  General Vaccines are recommended including tetanus vaccine every 10 years, flu shot yearly  I recommend you see a dentist every year for routine dental care  I recommend you see an eye doctor yearly for routine eye care  Continue healthy eating habits and regular exercise.  Your blood pressure, height and weight are normal  today  Tuere was seen today for new patient (initial visit).  Diagnoses and all orders for this visit:  Encounter for health maintenance examination in adult  Language barrier  Vaccine counseling  Screening for cervical cancer  Screening mammogram for breast cancer  Screening for hematuria or proteinuria  Assessment examination refused    F/u prn       [1] No Known Allergies [2]  No current outpatient medications on file prior to visit.   No current facility-administered medications on file prior to visit.  [3] No current outpatient medications on file.  "
